# Patient Record
Sex: Male | Born: 1945 | Race: White | Hispanic: No | Marital: Married | Smoking: Light tobacco smoker
Health system: Southern US, Community
[De-identification: ages and names within clinical notes are randomized; demographics above are authoritative.]

## PROBLEM LIST (undated history)

## (undated) DIAGNOSIS — M25819 Other specified joint disorders, unspecified shoulder: Secondary | ICD-10-CM

## (undated) DIAGNOSIS — R55 Syncope and collapse: Secondary | ICD-10-CM

## (undated) DIAGNOSIS — M7540 Impingement syndrome of unspecified shoulder: Secondary | ICD-10-CM

## (undated) DIAGNOSIS — I428 Other cardiomyopathies: Secondary | ICD-10-CM

## (undated) DIAGNOSIS — I4891 Unspecified atrial fibrillation: Secondary | ICD-10-CM

## (undated) DIAGNOSIS — I442 Atrioventricular block, complete: Secondary | ICD-10-CM

## (undated) DIAGNOSIS — R0602 Shortness of breath: Secondary | ICD-10-CM

## (undated) DIAGNOSIS — C787 Secondary malignant neoplasm of liver and intrahepatic bile duct: Secondary | ICD-10-CM

## (undated) DIAGNOSIS — I509 Heart failure, unspecified: Secondary | ICD-10-CM

## (undated) DIAGNOSIS — C189 Malignant neoplasm of colon, unspecified: Secondary | ICD-10-CM

## (undated) DIAGNOSIS — I2510 Atherosclerotic heart disease of native coronary artery without angina pectoris: Secondary | ICD-10-CM

## (undated) DIAGNOSIS — I10 Essential (primary) hypertension: Secondary | ICD-10-CM

## (undated) DIAGNOSIS — I5022 Chronic systolic (congestive) heart failure: Secondary | ICD-10-CM

## (undated) DIAGNOSIS — Z950 Presence of cardiac pacemaker: Secondary | ICD-10-CM

## (undated) HISTORY — PX: LIVER RESECTION: SHX1977

## (undated) HISTORY — PX: REFRACTIVE SURGERY: SHX103

## (undated) HISTORY — DX: Syncope and collapse: R55

## (undated) HISTORY — DX: Atrioventricular block, complete: I44.2

## (undated) HISTORY — PX: COLON SURGERY: SHX602

---

## 2012-11-02 ENCOUNTER — Encounter (HOSPITAL_COMMUNITY): Payer: Self-pay | Admitting: Emergency Medicine

## 2012-11-02 ENCOUNTER — Institutional Professional Consult (permissible substitution): Payer: Self-pay | Admitting: Cardiology

## 2012-11-02 ENCOUNTER — Encounter (HOSPITAL_COMMUNITY): Payer: Self-pay

## 2012-11-02 ENCOUNTER — Emergency Department (HOSPITAL_COMMUNITY): Payer: Medicare Other

## 2012-11-02 ENCOUNTER — Inpatient Hospital Stay (HOSPITAL_COMMUNITY)
Admission: EM | Admit: 2012-11-02 | Discharge: 2012-11-06 | DRG: 244 | Disposition: A | Discharge status: Home / Self Care | Payer: Medicare Other | Attending: Cardiology | Admitting: Cardiology

## 2012-11-02 DIAGNOSIS — I459 Conduction disorder, unspecified: Secondary | ICD-10-CM | POA: Diagnosis present

## 2012-11-02 DIAGNOSIS — Z79899 Other long term (current) drug therapy: Secondary | ICD-10-CM

## 2012-11-02 DIAGNOSIS — W19XXXA Unspecified fall, initial encounter: Secondary | ICD-10-CM | POA: Diagnosis present

## 2012-11-02 DIAGNOSIS — F17200 Nicotine dependence, unspecified, uncomplicated: Secondary | ICD-10-CM | POA: Diagnosis present

## 2012-11-02 DIAGNOSIS — I10 Essential (primary) hypertension: Secondary | ICD-10-CM | POA: Diagnosis present

## 2012-11-02 DIAGNOSIS — Y92009 Unspecified place in unspecified non-institutional (private) residence as the place of occurrence of the external cause: Secondary | ICD-10-CM

## 2012-11-02 DIAGNOSIS — I442 Atrioventricular block, complete: Principal | ICD-10-CM | POA: Diagnosis present

## 2012-11-02 DIAGNOSIS — S022XXA Fracture of nasal bones, initial encounter for closed fracture: Secondary | ICD-10-CM | POA: Diagnosis present

## 2012-11-02 DIAGNOSIS — R55 Syncope and collapse: Secondary | ICD-10-CM

## 2012-11-02 DIAGNOSIS — Y998 Other external cause status: Secondary | ICD-10-CM

## 2012-11-02 HISTORY — DX: Essential (primary) hypertension: I10

## 2012-11-02 LAB — GLUCOSE, CAPILLARY: Glucose-Capillary: 129 mg/dL — ABNORMAL HIGH (ref 70–99)

## 2012-11-02 LAB — URINALYSIS, ROUTINE W REFLEX MICROSCOPIC
Bilirubin Urine: NEGATIVE
Glucose, UA: NEGATIVE mg/dL
Ketones, ur: NEGATIVE mg/dL
Leukocytes, UA: NEGATIVE
Nitrite: NEGATIVE
Protein, ur: NEGATIVE mg/dL
Specific Gravity, Urine: 1.017 (ref 1.005–1.030)
Urobilinogen, UA: 0.2 mg/dL (ref 0.0–1.0)
pH: 5.5 (ref 5.0–8.0)

## 2012-11-02 LAB — BASIC METABOLIC PANEL
CO2: 25 mEq/L (ref 19–32)
Chloride: 100 mEq/L (ref 96–112)
Glucose, Bld: 125 mg/dL — ABNORMAL HIGH (ref 70–99)
Potassium: 4 mEq/L (ref 3.5–5.1)
Sodium: 134 mEq/L — ABNORMAL LOW (ref 135–145)

## 2012-11-02 LAB — URINE MICROSCOPIC-ADD ON

## 2012-11-02 LAB — TROPONIN I: Troponin I: <0.3 ng/mL (ref <0.30)

## 2012-11-02 LAB — CBC
Hemoglobin: 15.5 g/dL (ref 13.0–17.0)
RBC: 4.79 MIL/uL (ref 4.22–5.81)

## 2012-11-02 LAB — CREATININE, SERUM: Creatinine, Ser: 0.76 mg/dL (ref 0.50–1.35)

## 2012-11-02 MED ORDER — ZOLPIDEM TARTRATE 5 MG PO TABS: 5.0000 mg | ORAL_TABLET | Freq: Every evening | ORAL | Status: DC | PRN

## 2012-11-02 MED ORDER — ONDANSETRON HCL 4 MG/2ML IJ SOLN: 4.0000 mg | Freq: Four times a day (QID) | INTRAMUSCULAR | Status: DC | PRN

## 2012-11-02 MED ORDER — ENOXAPARIN SODIUM 40 MG/0.4ML ~~LOC~~ SOLN
40.0000 mg | SUBCUTANEOUS | Status: DC
  Filled 2012-11-02 (×5): qty 0.4

## 2012-11-02 MED ORDER — MAGNESIUM 100 MG PO TABS: 100.0000 mg | ORAL_TABLET | Freq: Every day | ORAL | Status: DC

## 2012-11-02 MED ORDER — NITROGLYCERIN 0.4 MG SL SUBL: 0.4000 mg | SUBLINGUAL_TABLET | SUBLINGUAL | Status: DC | PRN

## 2012-11-02 MED ORDER — ADULT MULTIVITAMIN W/MINERALS CH: 1.0000 | ORAL_TABLET | Freq: Every day | ORAL | Status: DC

## 2012-11-02 MED ORDER — VITAMIN C 500 MG PO TABS: 500.0000 mg | ORAL_TABLET | Freq: Every day | ORAL | Status: DC

## 2012-11-02 MED ORDER — LISINOPRIL 10 MG PO TABS: 15.0000 mg | ORAL_TABLET | Freq: Every day | ORAL | Status: DC

## 2012-11-02 MED ORDER — ASPIRIN EC 81 MG PO TBEC
81.0000 mg | DELAYED_RELEASE_TABLET | Freq: Every day | ORAL | Status: DC
  Administered 2012-11-04 – 2012-11-06 (×3): 81 mg via ORAL
  Filled 2012-11-02 (×4): qty 1

## 2012-11-02 MED ORDER — POTASSIUM 75 MG PO TABS: 75.0000 mg | ORAL_TABLET | Freq: Every day | ORAL | Status: DC

## 2012-11-02 MED ORDER — ALPRAZOLAM 0.25 MG PO TABS: 0.2500 mg | ORAL_TABLET | Freq: Two times a day (BID) | ORAL | Status: DC | PRN

## 2012-11-02 MED ORDER — SODIUM CHLORIDE 0.9 % IV SOLN: 250.0000 mL | INTRAVENOUS | Status: DC | PRN

## 2012-11-02 MED ORDER — SODIUM CHLORIDE 0.9 % IJ SOLN
3.0000 mL | Freq: Two times a day (BID) | INTRAMUSCULAR | Status: DC
  Administered 2012-11-03 – 2012-11-05 (×4): 3 mL via INTRAVENOUS

## 2012-11-02 MED ORDER — ACETAMINOPHEN 325 MG PO TABS
650.0000 mg | ORAL_TABLET | ORAL | Status: DC | PRN
  Administered 2012-11-05: 650 mg via ORAL
  Filled 2012-11-02: qty 2

## 2012-11-02 MED ORDER — SODIUM CHLORIDE 0.9 % IJ SOLN: 3.0000 mL | INTRAMUSCULAR | Status: DC | PRN

## 2012-11-02 NOTE — ED Notes (Signed)
Called report to Dora, RN>

## 2012-11-02 NOTE — Consult Note (Addendum)
CARDIOLOGY CONSULT NOTE   Patient ID: Tan Clopper MRN: [id number] DOB/AGE: 1946-03-21 67 y.o.  Admit date: 11/02/2012  Primary Physician   Default, Provider, MD Primary Cardiologist  ? [facility] - Had appt scheduled for today. Reason for Consultation   Syncope and collapse  HPI: Mr. Colina is a 67 year old male with past history of HTN who presents to the ED with a syncopal episode. He has been having episodic presyncope. Various times of day, not consistently associated with exertion. For the last 3 weeks, has had episodes almost daily, sometimes more than 1/day. Symptoms improve with sitting down. Pt has had episodes of a "blah" feeling that start while sitting and resolve spontaneously. Pt has had no orthostatic symptoms. Pt has also had brief episodes of a light-headed feeling when he turns his head suddenly during exercise. Once it was severe enough to require an ER visit but he was hydrated and released. He uses No-Salt on foods. Saw his family MD yesterday for the light-headed feeling and had orthostatic VS done which were negative.  He has never had palpitations or chest pain. In the last 20 days, he has had about 10 episodes.  Today, he had walked around, down a hill and was walking back up. He began feeling light-headed and bent over. He fell, briefly losing consciousness. He got up almost immediately and went into the house, going into the kitchen. His wife was present and he was doing appropriate things, wiping the blood off, not staggering or swerving, but was not completely aware of what was going on for a short period of time.    Past Medical History  Diagnosis Date  . Hypertension   . Syncope    Remote history of dehydration with exertion, leading to syncope.  approx 6 years ago.     Past Surgical History  Procedure Date  . Colon surgery   . Liver resection     Liver CA    No Known Allergies  I have reviewed the patient's current medications  Prior to Admission medications    Medication Sig Start Date End Date Taking? Authorizing Provider  Ascorbic Acid (VITAMIN C PO) Take 1 tablet by mouth daily. OTC   Yes Historical Provider, MD  lisinopril (PRINIVIL,ZESTRIL) 10 MG tablet Take 15 mg by mouth daily.   Yes Historical Provider, MD  MAGNESIUM PO Take 1 capsule by mouth daily. OTC   Yes Historical Provider, MD  Multiple Vitamin (MULTIVITAMIN WITH MINERALS) TABS Take 1 tablet by mouth daily.   Yes Historical Provider, MD  POTASSIUM PO Take 1 tablet by mouth daily. OTC   Yes Historical Provider, MD     History   Social History  . Marital Status: Married    Spouse Name: N/A    Number of Children: N/A  . Years of Education: N/A   Occupational History  . Not on file.   Social History Main Topics  . Smoking status: Light Tobacco Smoker    Types: Cigars  . Smokeless tobacco: Never Used  . Alcohol Use: Yes     Comment: very rarely  . Drug Use: No  . Sexually Active:    Other Topics Concern  . Not on file   Social History Narrative  . No narrative on file    Family Status  Relation Status Death Age  . Mother Deceased   . Father Deceased     Family History  Problem Relation Age of Onset  . Stroke Mother  95s  . Stroke Father 60     ROS: No recent illnesses, fevers or chills.   Full 14 point review of systems complete and found to be negative unless listed above.  Physical Exam: Blood pressure 150/77, pulse 79, temperature 98.4 F (36.9 C), temperature source Oral, resp. rate 17, SpO2 97.00%.  General: Well developed, well nourished, male in no acute distress Head: Eyes PERRLA, No xanthomas.   Normocephalic and atraumatic, oropharynx without edema or exudate. Dentition: good Lungs: clear bilateral  Heart: HRRR S1 S2, no rub/gallop, 2/6 systolic murmur. pulses are 2+ all 4 extrem.   Neck: No carotid bruits. No lymphadenopathy.  JVD not elevated. Abdomen: Bowel sounds present, abdomen soft and non-tender without masses or hernias noted. Msk:   No spine or cva tenderness. No weakness, no joint deformities or effusions. Extremities: No clubbing or cyanosis. No edema.  Neuro: Alert and oriented X 3. No focal deficits noted. Psych:  Good affect, responds appropriately Skin: No rashes, facial abrasions noted, deepest one just under the right side of his nose.  Labs:   Lab Results  Component Value Date   WBC 8.7 11/02/2012   HGB 15.5 11/02/2012   HCT 44.1 11/02/2012   MCV 92.1 11/02/2012   PLT 163 11/02/2012      Lab 11/02/12 1233  NA 134*  K 4.0  CL 100  CO2 25  BUN 18  CREATININE 0.75  CALCIUM 9.3  PROT --  BILITOT --  ALKPHOS --  ALT --  AST --  GLUCOSE 125*    Basename 11/02/12 1233  CKTOTAL --  CKMB --  TROPONINI <0.30   ECG:   02-Nov-2012 12:14:11 SINUS RHYTHM ~ normal P axis, V-rate 50- 99 FIRST DEGREE AV BLOCK ~ PR >220, V-rate 50- 90 RBBB AND LPFB ~ QRSd >177mS, axis(90,210) Standard 12 Lead Report ~ Unconfirmed Interpretation Abnormal ECG 62mm/s 85mm/mV 150Hz  8.0.1 12SL 235 CID: [id number] Referred by: Unconfirmed Vent. rate 88 BPM PR interval 268 ms QRS duration 140 ms QT/QTc 392/474 ms P-R-T axes 62 86 48  Radiology:   Ct Maxillofacial Wo Cm 11/02/2012  *RADIOLOGY REPORT*  Clinical Data:  Syncopal episode.  Fell.  CT HEAD WITHOUT CONTRAST CT MAXILLOFACIAL WITHOUT CONTRAST  Technique:  Multidetector CT imaging of the head and maxillofacial structures were performed using the standard protocol without intravenous contrast. Multiplanar CT image reconstructions of the maxillofacial structures were also generated.  Comparison:   None.  CT HEAD  Findings: The ventricles are normal.  No extra-axial fluid collections are seen.  The brainstem and cerebellum are unremarkable.  No acute intracranial findings such as infarction or hemorrhage.  No mass lesions.  The bony structures are intact.  No skull fracture or bone lesion. There is fluid in the left maxillary sinus and a fractured left nasal bone.  The mastoid  air cells and middle ear cavities are clear.  IMPRESSION: No acute intracranial findings or skull fracture.  CT MAXILLOFACIAL  Findings:   There is a mildly displaced fracture of the left nasal bone and a small nondisplaced fracture involving the right nasal bone.  A fracture of the bony nasal bridge is also noted.  There is associated fluid in the left ethmoid air cells and left maxillary sinus.  The bony nasal septum is intact.  The maxillary sinus walls are intact and the orbital bones are intact.  The globes appear normal.  The mandible is intact and the mandibular condyles are normally located.  IMPRESSION:  1.  Nasal  bone fractures with displacement to the left. 2.  No other facial bone fractures are identified. 3.  Fluid/blood in the left maxillary sinus and scattered left- sided ethmoid air cells.   Original Report Authenticated By: Rudie Meyer, M.D.     ASSESSMENT AND PLAN:    Principal Problem:  *Syncope and collapse - admit, cycle enzymes, ck echo, keep on telemetry. If all is OK, consider D/C in am and outpatient f/u with event monitor.  Otherwise, continue home meds.  Active Problems:  Hypertension   Signed: Caroline More PA-S2 11/02/2012, 4:40 PM Co-Sign MD  Seen and agree, changes made. Theodore Demark, PA 11/02/2012 4:46 PM  As above, patient seen and examined. Briefly he is a 67 year old male with a past medical history of hypertension who presents with syncope. He has noticed some dyspnea on exertion recently but no orthopnea, PND, pedal edema, palpitations or exertional chest pain. Over the past 3 weeks he has had "lightheaded" spells. These are not related to position. They occur suddenly and resolve within one to 2 seconds. There is no associated nausea, dyspnea, chest pain or palpitations. No neurological symptoms. Today while walking up his driveway he developed an episode. He bent down to see if his symptoms would resolve and had a frank syncopal episode. He had trauma to  his face. He was unconscious for a brief amount of time but cannot quantitate. He is presently asymptomatic. His electrocardiogram shows sinus rhythm, first degree AV block and right bundle branch block. CT shows nasal bone fractures with displacement to the left. Initial cardiac markers negative. Patient syncope is concerning for bradycardia mediated event. Plan to admit to telemetry. Cycle enzymes. Check echocardiogram for LV function. He will also need a functional study to exclude ischemia. He will ultimately need an electrophysiology consult. If his telemetry is unrevealing he may require EP study to measure HV interval and if prolonged pacemaker placement. I discussed this with the patient. He stated that he would stay overnight but would not agree to stay for the weekend. I stated that if he leaves he will need to sign out AGAINST MEDICAL ADVICE. He would also risk further syncope and trauma. I explained that if he leaves he cannot drive. I am not convinced that he will stay in the hospital. Olga Millers 5:28 PM

## 2012-11-02 NOTE — ED Provider Notes (Addendum)
Medical screening examination/treatment/procedure(s) were conducted as a shared visit with non-physician practitioner(s) and myself.  I personally evaluated the patient during the encounter  The patient's story is concerning for cardiogenic syncope.  Cardiology will likely with patient at bedside.  EKG without any QT prolongation or significant abnormalities.  No arrhythmia noted.  I reviewed the EMS strips which had several episodes of single PVCs without other ectopy noted.  Given his exertional shortness of breath with exertional dizziness and somewhat one episode of palpitations I think the patient will need an aggressive workup in the hospital to rule out significant pathology.  Fracture Reduction Patient consented to fracture reduction of his nasal bones.  They were reduced with manipulation with improvement in the alignment of his nasal bones as well as his breathing.  Patient tolerated the procedure well.  No sedation.  Lyanne Co, MD 11/02/12 1510  Lyanne Co, MD 11/02/12 [id number]

## 2012-11-02 NOTE — ED Notes (Signed)
Patient has an abrasion to his forehead and right hand. He has bruising to his nose, which is fractured.

## 2012-11-02 NOTE — ED Notes (Signed)
2nd attempt made to give report, caesar, RN is in shift report. New RN will call when available.

## 2012-11-02 NOTE — ED Notes (Signed)
Attempted to give report to Caesar, Charity fundraiser. Unavailable at this time.

## 2012-11-02 NOTE — ED Provider Notes (Signed)
History     CSN: [id number]  Arrival date & time 11/02/12  1213   First MD Initiated Contact with Patient 11/02/12 1218      Chief Complaint  Patient presents with  . Loss of Consciousness    (Consider location/radiation/quality/duration/timing/severity/associated sxs/prior treatment) HPI Patient presents to the emergency department following a syncopal episode.  Patient states that he was walking up his driveway at the time that he passed out.  Patient states that over the last little, while he's had lightheadedness and dizziness, and saw his primary care doctor yesterday for the symptoms.  Patient states that he also had exertional shortness of breath during this same time frame.  Patient denies chest pain, nausea, vomiting, weakness, numbness, abdominal pain, blurred vision, headache, neck pain, or fever.  Past Medical History  Diagnosis Date  . Hypertension   . Syncope     Past Surgical History  Procedure Date  . Colon surgery   . Liver resection     Liver CA    No family history on file.  History  Substance Use Topics  . Smoking status: Light Tobacco Smoker    Types: Cigars  . Smokeless tobacco: Never Used  . Alcohol Use: Yes     Comment: very rarely      Review of Systems All other systems negative except as documented in the HPI. All pertinent positives and negatives as reviewed in the HPI. Allergies  Review of patient's allergies indicates no known allergies.  Home Medications   Current Outpatient Rx  Name  Route  Sig  Dispense  Refill  . VITAMIN C PO   Oral   Take 1 tablet by mouth daily. OTC         . LISINOPRIL 10 MG PO TABS   Oral   Take 15 mg by mouth daily.         Marland Kitchen MAGNESIUM PO   Oral   Take 1 capsule by mouth daily. OTC         . ADULT MULTIVITAMIN W/MINERALS CH   Oral   Take 1 tablet by mouth daily.         Marland Kitchen POTASSIUM PO   Oral   Take 1 tablet by mouth daily. OTC           BP 186/75  Pulse 86  Temp 98.4 F (36.9  C) (Oral)  Resp 24  SpO2 97%  Physical Exam  Nursing note and vitals reviewed. Constitutional: He is oriented to person, place, and time. He appears well-developed and well-nourished. No distress.  HENT:  Head: Normocephalic. Head is with abrasion.    Mouth/Throat: Oropharynx is clear and moist.  Eyes: EOM are normal. Pupils are equal, round, and reactive to light.  Neck: Normal range of motion. Neck supple.  Cardiovascular: Normal rate, regular rhythm and normal heart sounds.  Exam reveals no gallop and no friction rub.   No murmur heard. Pulmonary/Chest: Effort normal and breath sounds normal. No respiratory distress.  Neurological: He is alert and oriented to person, place, and time. He has normal strength. No sensory deficit. He displays a negative Romberg sign. Coordination and gait normal.  Skin: Skin is warm and dry.    ED Course  Procedures (including critical care time)  Labs Reviewed  BASIC METABOLIC PANEL - Abnormal; Notable for the following:    Sodium 134 (*)     Glucose, Bld 125 (*)     All other components within normal limits  URINALYSIS, ROUTINE W  REFLEX MICROSCOPIC - Abnormal; Notable for the following:    Hgb urine dipstick TRACE (*)     All other components within normal limits  GLUCOSE, CAPILLARY - Abnormal; Notable for the following:    Glucose-Capillary 129 (*)     All other components within normal limits  CBC  TROPONIN I  URINE MICROSCOPIC-ADD ON   Ct Head Wo Contrast  11/02/2012  *RADIOLOGY REPORT*  Clinical Data:  Syncopal episode.  Fell.  CT HEAD WITHOUT CONTRAST CT MAXILLOFACIAL WITHOUT CONTRAST  Technique:  Multidetector CT imaging of the head and maxillofacial structures were performed using the standard protocol without intravenous contrast. Multiplanar CT image reconstructions of the maxillofacial structures were also generated.  Comparison:   None.  CT HEAD  Findings: The ventricles are normal.  No extra-axial fluid collections are seen.  The  brainstem and cerebellum are unremarkable.  No acute intracranial findings such as infarction or hemorrhage.  No mass lesions.  The bony structures are intact.  No skull fracture or bone lesion. There is fluid in the left maxillary sinus and a fractured left nasal bone.  The mastoid air cells and middle ear cavities are clear.  IMPRESSION: No acute intracranial findings or skull fracture.  CT MAXILLOFACIAL  Findings:   There is a mildly displaced fracture of the left nasal bone and a small nondisplaced fracture involving the right nasal bone.  A fracture of the bony nasal bridge is also noted.  There is associated fluid in the left ethmoid air cells and left maxillary sinus.  The bony nasal septum is intact.  The maxillary sinus walls are intact and the orbital bones are intact.  The globes appear normal.  The mandible is intact and the mandibular condyles are normally located.  IMPRESSION:  1.  Nasal bone fractures with displacement to the left. 2.  No other facial bone fractures are identified. 3.  Fluid/blood in the left maxillary sinus and scattered left- sided ethmoid air cells.   Original Report Authenticated By: Rudie Meyer, M.D.    Ct Maxillofacial Wo Cm  11/02/2012  *RADIOLOGY REPORT*  Clinical Data:  Syncopal episode.  Fell.  CT HEAD WITHOUT CONTRAST CT MAXILLOFACIAL WITHOUT CONTRAST  Technique:  Multidetector CT imaging of the head and maxillofacial structures were performed using the standard protocol without intravenous contrast. Multiplanar CT image reconstructions of the maxillofacial structures were also generated.  Comparison:   None.  CT HEAD  Findings: The ventricles are normal.  No extra-axial fluid collections are seen.  The brainstem and cerebellum are unremarkable.  No acute intracranial findings such as infarction or hemorrhage.  No mass lesions.  The bony structures are intact.  No skull fracture or bone lesion. There is fluid in the left maxillary sinus and a fractured left nasal bone.   The mastoid air cells and middle ear cavities are clear.  IMPRESSION: No acute intracranial findings or skull fracture.  CT MAXILLOFACIAL  Findings:   There is a mildly displaced fracture of the left nasal bone and a small nondisplaced fracture involving the right nasal bone.  A fracture of the bony nasal bridge is also noted.  There is associated fluid in the left ethmoid air cells and left maxillary sinus.  The bony nasal septum is intact.  The maxillary sinus walls are intact and the orbital bones are intact.  The globes appear normal.  The mandible is intact and the mandibular condyles are normally located.  IMPRESSION:  1.  Nasal bone fractures with displacement to  the left. 2.  No other facial bone fractures are identified. 3.  Fluid/blood in the left maxillary sinus and scattered left- sided ethmoid air cells.   Original Report Authenticated By: Rudie Meyer, M.D.      1. Syncope    I spoke with [facility] cardiology and they will be down to see the patient.  My concern is the patient's having exertional shortness of breath, along with this syncopal episode.  There could be a cardiac cause for his syncope.   MDM  MDM Reviewed: nursing note and vitals Interpretation: labs, ECG and CT scan Consults: cardiology    Date: 11/02/2012  Rate: 88 Rhythm: normal sinus rhythm  QRS Axis: normal  Intervals: normal  ST/T Wave abnormalities: normal  Conduction Disutrbances:first-degree A-V block   Narrative Interpretation:   Old EKG Reviewed: none available            Carlyle Dolly, PA-C 11/02/12 1504

## 2012-11-02 NOTE — ED Notes (Signed)
Per EMS: Pt went to retrieve his newspaper and had a syncopal episode. Hx HTN. Takes lisinopril. scapes and minor injuries to face and fingers of R hand. CBG 129.

## 2012-11-03 DIAGNOSIS — I059 Rheumatic mitral valve disease, unspecified: Secondary | ICD-10-CM

## 2012-11-03 LAB — COMPREHENSIVE METABOLIC PANEL
ALT: 32 U/L (ref 0–53)
Alkaline Phosphatase: 52 U/L (ref 39–117)
CO2: 23 mEq/L (ref 19–32)
Calcium: 8.8 mg/dL (ref 8.4–10.5)
GFR calc Af Amer: >90 mL/min (ref >90)
GFR calc non Af Amer: >90 mL/min (ref >90)
Glucose, Bld: 113 mg/dL — ABNORMAL HIGH (ref 70–99)
Sodium: 136 mEq/L (ref 135–145)

## 2012-11-03 LAB — MRSA PCR SCREENING: MRSA by PCR: NEGATIVE

## 2012-11-03 NOTE — Progress Notes (Signed)
Patient: Jaceyon Strole Date of Encounter: 11/03/2012, 9:41 AM Admit date: 11/02/2012     Subjective  Mr. Pelissier reports "lightheadedness" this morning ~5 AM after he used the bathroom. This felt similar to what he has been experiencing daily x 2-3 weeks. Had syncope with significant facial trauma yesterday prompting his admission. There is evidence of CHB on telemetry at 5:09 AM today with 3.5 second pauses.     Objective  Physical Exam: Vitals: BP 128/78  Pulse 67  Temp 98.1 F (36.7 C) (Oral)  Resp 17  Ht 5\' 9"  (1.753 m)  Wt 213 lb 13.5 oz (97 kg)  BMI 31.58 kg/m2  SpO2 95% General: Well developed, well appearing 67 year old male in no acute distress. Neck: Supple. JVD not elevated. Lungs: Clear bilaterally to auscultation without wheezes, rales, or rhonchi. Breathing is unlabored. Heart: RRR S1 S2 without murmur, rub or gallop.  Abdomen: Soft, non-distended. Extremities: No clubbing or cyanosis. No edema.  Distal pedal pulses are 2+ and equal bilaterally. Neuro: Alert and oriented X 3. Moves all extremities spontaneously. No focal deficits.  Intake/Output: No intake or output data in the 24 hours ending 11/03/12 0941  Inpatient Medications:     . aspirin EC  81 mg Oral Daily  . enoxaparin (LOVENOX) injection  40 mg Subcutaneous Q24H  . sodium chloride  3 mL Intravenous Q12H    Labs:  Basename 11/03/12 0521 11/02/12 1813 11/02/12 1233  NA 136 -- 134*  K 4.1 -- 4.0  CL 99 -- 100  CO2 23 -- 25  GLUCOSE 113* -- 125*  BUN 15 -- 18  CREATININE 0.76 0.76 --  CALCIUM 8.8 -- 9.3  MG -- -- --  PHOS -- -- --    Basename 11/03/12 0521  AST 28  ALT 32  ALKPHOS 52  BILITOT 1.4*  PROT 6.9  ALBUMIN 3.5    Basename 11/02/12 1233  WBC 8.7  NEUTROABS --  HGB 15.5  HCT 44.1  MCV 92.1  PLT 163    Basename 11/03/12 0521 11/02/12 2339 11/02/12 1813 11/02/12 1233  CKTOTAL -- -- -- --  CKMB -- -- -- --  TROPONINI <0.30 <0.30 <0.30 <0.30     Basename 11/02/12 1813   TSH 0.588  T4TOTAL --  T3FREE --  THYROIDAB --    Radiology/Studies: Ct Head Wo Contrast  11/02/2012  *RADIOLOGY REPORT*  Clinical Data:  Syncopal episode.  Fell.  CT HEAD WITHOUT CONTRAST CT MAXILLOFACIAL WITHOUT CONTRAST  Technique:  Multidetector CT imaging of the head and maxillofacial structures were performed using the standard protocol without intravenous contrast. Multiplanar CT image reconstructions of the maxillofacial structures were also generated.  Comparison:   None.  CT HEAD  Findings: The ventricles are normal.  No extra-axial fluid collections are seen.  The brainstem and cerebellum are unremarkable.  No acute intracranial findings such as infarction or hemorrhage.  No mass lesions.  The bony structures are intact.  No skull fracture or bone lesion. There is fluid in the left maxillary sinus and a fractured left nasal bone.  The mastoid air cells and middle ear cavities are clear.  IMPRESSION: No acute intracranial findings or skull fracture.  CT MAXILLOFACIAL  Findings:   There is a mildly displaced fracture of the left nasal bone and a small nondisplaced fracture involving the right nasal bone.  A fracture of the bony nasal bridge is also noted.  There is associated fluid in the left ethmoid air cells and  left maxillary sinus.  The bony nasal septum is intact.  The maxillary sinus walls are intact and the orbital bones are intact.  The globes appear normal.  The mandible is intact and the mandibular condyles are normally located.  IMPRESSION:  1.  Nasal bone fractures with displacement to the left. 2.  No other facial bone fractures are identified. 3.  Fluid/blood in the left maxillary sinus and scattered left- sided ethmoid air cells.   Original Report Authenticated By: Rudie Meyer, M.D.    Ct Maxillofacial Wo Cm  11/02/2012  *RADIOLOGY REPORT*  Clinical Data:  Syncopal episode.  Fell.  CT HEAD WITHOUT CONTRAST CT MAXILLOFACIAL WITHOUT CONTRAST  Technique:  Multidetector CT imaging  of the head and maxillofacial structures were performed using the standard protocol without intravenous contrast. Multiplanar CT image reconstructions of the maxillofacial structures were also generated.  Comparison:   None.  CT HEAD  Findings: The ventricles are normal.  No extra-axial fluid collections are seen.  The brainstem and cerebellum are unremarkable.  No acute intracranial findings such as infarction or hemorrhage.  No mass lesions.  The bony structures are intact.  No skull fracture or bone lesion. There is fluid in the left maxillary sinus and a fractured left nasal bone.  The mastoid air cells and middle ear cavities are clear.  IMPRESSION: No acute intracranial findings or skull fracture.  CT MAXILLOFACIAL  Findings:   There is a mildly displaced fracture of the left nasal bone and a small nondisplaced fracture involving the right nasal bone.  A fracture of the bony nasal bridge is also noted.  There is associated fluid in the left ethmoid air cells and left maxillary sinus.  The bony nasal septum is intact.  The maxillary sinus walls are intact and the orbital bones are intact.  The globes appear normal.  The mandible is intact and the mandibular condyles are normally located.  IMPRESSION:  1.  Nasal bone fractures with displacement to the left. 2.  No other facial bone fractures are identified. 3.  Fluid/blood in the left maxillary sinus and scattered left- sided ethmoid air cells.   Original Report Authenticated By: Rudie Meyer, M.D.     Echocardiogram: pending  Telemetry: sinus rhythm with first degree AV block, RBBB; intermittent CHB with 3.5 second pauses this AM    Assessment and Plan  1. Complete heart block 2. Syncope, most likely due to #1 Mr. Ashe now has documented symptomatic CHB which is most likely the cause for his syncopal episode yesterday. His ECG from admission shows conduction abnormalities at baseline with 1st degree AV block and RBBB. CEs negative. TSH within normal  range. No reversible causes identified. Discussed indications and need for PPM with Mr. Novosel and his wife. This can be done on Monday. Echo to be done today to assess heart structure and function. Will transfer him to the CCU and continue to monitor.   Signed, Rick Duff PA-C  Patient examined chart and telemetry reviewed.  5:00 am had CHB with rates in 20 with lightheadedness.  Clearly his syncope is related to this.  Long discussion about need for  Pacer.  Willing to stay.  Transfer to unit.  Hopefully echo can be done over weekend  [employer]

## 2012-11-03 NOTE — Progress Notes (Signed)
  Echocardiogram 2D Echocardiogram has been performed.  Randen Kauth FRANCES 11/03/2012, 4:48 PM

## 2012-11-03 NOTE — Progress Notes (Signed)
Pt transferred to 2900, report called to [facility].

## 2012-11-03 NOTE — Progress Notes (Signed)
Pt refused to take his Lovenox injection. Pt stated that he does not have to take same because he is having surgery.  Pt education done. Pt verbalized understanding. We will continue to educate and support patient.

## 2012-11-03 NOTE — H&P (Signed)
Please see note labeled consult from 1-24 [facility]

## 2012-11-03 NOTE — Care Management Utilization Note (Signed)
UR completed 

## 2012-11-03 NOTE — Progress Notes (Signed)
Notified Plitt, PA of changes on telemetry. PA came to the floor and stated the patient was having blocked PAC's with a long PR interval afterwards. Patient is asymptomatic. Will continue to monitor. Earnest Conroy RN

## 2012-11-04 DIAGNOSIS — I442 Atrioventricular block, complete: Secondary | ICD-10-CM | POA: Diagnosis present

## 2012-11-04 MED ORDER — SODIUM CHLORIDE 0.9 % IJ SOLN: 3.0000 mL | Freq: Two times a day (BID) | INTRAMUSCULAR | Status: DC

## 2012-11-04 MED ORDER — CHLORHEXIDINE GLUCONATE 4 % EX LIQD
60.0000 mL | Freq: Once | CUTANEOUS | Status: AC
  Administered 2012-11-04: 4 via TOPICAL
  Filled 2012-11-04: qty 60

## 2012-11-04 MED ORDER — CHLORHEXIDINE GLUCONATE 4 % EX LIQD
60.0000 mL | Freq: Once | CUTANEOUS | Status: AC
  Administered 2012-11-05: 4 via TOPICAL
  Filled 2012-11-04 (×2): qty 60

## 2012-11-04 MED ORDER — SODIUM CHLORIDE 0.9 % IJ SOLN: 3.0000 mL | INTRAMUSCULAR | Status: DC | PRN

## 2012-11-04 MED ORDER — SODIUM CHLORIDE 0.9 % IV SOLN
250.0000 mL | INTRAVENOUS | Status: DC
  Administered 2012-11-05: 50 mL via INTRAVENOUS

## 2012-11-04 MED ORDER — SODIUM CHLORIDE 0.45 % IV SOLN
INTRAVENOUS | Status: DC
  Administered 2012-11-05: 50 mL via INTRAVENOUS

## 2012-11-04 MED ORDER — CEFAZOLIN SODIUM-DEXTROSE 2-3 GM-% IV SOLR
2.0000 g | INTRAVENOUS | Status: DC
  Filled 2012-11-04: qty 50

## 2012-11-04 MED ORDER — SODIUM CHLORIDE 0.9 % IR SOLN
80.0000 mg | Status: DC
  Filled 2012-11-04: qty 2

## 2012-11-04 NOTE — Progress Notes (Signed)
Pt. Refusing to take ASA as prescribed by MD.  Pt. Does not wish to view video on PM, but did take the informational booklet for he and his wife.  Procedure described to pt. And wife.  Pt is, by his own admission, not wishing to go through with PM implantation, but when alternatives described to him, did consent to procedure.  Provided time for he and his wife to ask appropriate questions.  Utilized "teach-back" method.  Pt. States he is "not one to take medications or to go to any doctor's office."  Importance of post-PM care re-iterated.

## 2012-11-04 NOTE — Progress Notes (Signed)
Pt wanting to leave AMA.  Educated pt. Md notified. Pt refused to talk with Md.  Wife refused to take pt home.  Pt staying but not happy.   Will continue to monitor. Emilie [place]

## 2012-11-04 NOTE — Progress Notes (Signed)
   SUBJECTIVE:  No further complaints.   PHYSICAL EXAM Filed Vitals:   11/03/12 2200 11/04/12 0200 11/04/12 0554 11/04/12 0808  BP: 124/59 128/68 133/67   Pulse: 63 63 69   Temp:  98 F (36.7 C) 97.5 F (36.4 C) 98.3 F (36.8 C)  TempSrc:  Oral Oral Oral  Resp:    20  Height:      Weight:   214 lb 11.7 oz (97.4 kg)   SpO2: 94% 95% 95%    General:  No distress Lungs:  Clear Heart:  RRR Abdomen:  Positive bowel sounds, no rebound no guarding Extremities:  No edema  LABS: Lab Results  Component Value Date   TROPONINI <0.30 11/03/2012   Results for orders placed during the hospital encounter of 11/02/12 (from the past 24 hour(s))  MRSA PCR SCREENING     Status: Normal   Collection Time   11/03/12  2:23 PM      Component Value Range   MRSA by PCR NEGATIVE  NEGATIVE    Intake/Output Summary (Last 24 hours) at 11/04/12 1036 Last data filed at 11/04/12 0600  Gross per 24 hour  Intake     60 ml  Output    550 ml  Net   -490 ml    EKG:   NSR, RBBB, first degree AV block, no acute ST T wave changes.  11/04/2012  ASSESSMENT AND PLAN:  Complete heart block:  Plan for PPM in am.  Telemetry without further episodes of CH block.  Echo completed and results pending.    Syncope:  See above.  HTN:  BP well controlled.     Fayrene Fearing [facility] 11/04/2012 10:36 AM

## 2012-11-05 ENCOUNTER — Ambulatory Visit (HOSPITAL_COMMUNITY): Admit: 2012-11-05 | Payer: Self-pay | Admitting: Internal Medicine

## 2012-11-05 ENCOUNTER — Encounter (HOSPITAL_COMMUNITY)
Admission: EM | Disposition: A | Discharge status: Home / Self Care | Payer: Self-pay | Source: Home / Self Care | Attending: Cardiology

## 2012-11-05 ENCOUNTER — Inpatient Hospital Stay (HOSPITAL_COMMUNITY): Payer: Medicare Other

## 2012-11-05 DIAGNOSIS — I442 Atrioventricular block, complete: Principal | ICD-10-CM

## 2012-11-05 HISTORY — PX: PERMANENT PACEMAKER INSERTION: SHX5480

## 2012-11-05 HISTORY — PX: INSERT / REPLACE / REMOVE PACEMAKER: SUR710

## 2012-11-05 LAB — PROTIME-INR
INR: 1.06 (ref 0.00–1.49)
Prothrombin Time: 13.7 seconds (ref 11.6–15.2)

## 2012-11-05 SURGERY — PERMANENT PACEMAKER INSERTION
Anesthesia: LOCAL

## 2012-11-05 MED ORDER — ACETAMINOPHEN 325 MG PO TABS
325.0000 mg | ORAL_TABLET | ORAL | Status: DC | PRN
  Administered 2012-11-06 (×2): 650 mg via ORAL
  Filled 2012-11-05 (×2): qty 2

## 2012-11-05 MED ORDER — MIDAZOLAM HCL 5 MG/5ML IJ SOLN
INTRAMUSCULAR | Status: AC
  Filled 2012-11-05: qty 5

## 2012-11-05 MED ORDER — FENTANYL CITRATE 0.05 MG/ML IJ SOLN
INTRAMUSCULAR | Status: AC
  Filled 2012-11-05: qty 2

## 2012-11-05 MED ORDER — CEFAZOLIN SODIUM-DEXTROSE 2-3 GM-% IV SOLR
2.0000 g | Freq: Four times a day (QID) | INTRAVENOUS | Status: AC
  Administered 2012-11-05 – 2012-11-06 (×3): 2 g via INTRAVENOUS
  Filled 2012-11-05 (×3): qty 50

## 2012-11-05 MED ORDER — ONDANSETRON HCL 4 MG/2ML IJ SOLN: 4.0000 mg | Freq: Four times a day (QID) | INTRAMUSCULAR | Status: DC | PRN

## 2012-11-05 MED ORDER — YOU HAVE A PACEMAKER BOOK
Freq: Once | Status: AC
  Administered 2012-11-05: 18:00:00
  Filled 2012-11-05: qty 1

## 2012-11-05 MED ORDER — HEPARIN (PORCINE) IN NACL 2-0.9 UNIT/ML-% IJ SOLN
INTRAMUSCULAR | Status: AC
  Filled 2012-11-05: qty 500

## 2012-11-05 MED ORDER — WHITE PETROLATUM GEL
Status: AC
  Administered 2012-11-05: 0.2
  Filled 2012-11-05: qty 5

## 2012-11-05 MED ORDER — LIDOCAINE HCL (PF) 1 % IJ SOLN
INTRAMUSCULAR | Status: AC
  Filled 2012-11-05: qty 60

## 2012-11-05 NOTE — H&P (Signed)
  HPI: The patient is a 67 year old man who was admitted to the hospital with recurrent syncope. He is referred for evaluation as he was subsequently found to have periods of complete heart block. He has a history of medical problems including liver cancer and colon cancer. He has hypertension. His episodes of syncope and near-syncope have been present for many months. His current episode occurred several days ago. He was added in his yard, and passed out. He sustained a nasal bone fracture. The patient has had intermittent episodes of complete heart block while on telemetry in the hospital. He has been on no AV nodal blocking drugs. He denies chest pain or shortness of breath.  PMH:  Past Medical History   Diagnosis  Date   .  Hypertension    .  Syncope     PSHX:  Past Surgical History   Procedure  Date   .  Colon surgery    .  Liver resection      Liver CA    FAMHX:  Family History   Problem  Relation  Age of Onset   .  Stroke  Mother       40s    .  Stroke  Father  23    Social History: reports that he has been smoking Cigars. He has never used smokeless tobacco. He reports that he drinks alcohol. He reports that he does not use illicit drugs.  Allergies: No Known Allergies  Medications: Reviewed  Dg Chest 2 View  11/05/2012 *RADIOLOGY REPORT* Clinical Data: Syncopal episode. Pre pacemaker. CHEST - 2 VIEW Comparison: None. Findings: The cardiac silhouette is normal in size and configuration. No mediastinal or hilar masses or adenopathy. The lungs are clear. No pleural effusion or pneumothorax. Surgical vascular clips are noted in the right upper quadrant. The bony thorax is demineralized but intact. IMPRESSION: No acute cardiopulmonary disease. Original Report Authenticated By: Amie Portland, M.D.   ROS  As stated in the HPI and negative for all other systems.  Physical Exam  Well appearing NAD  HEENT: Unremarkable except for ecchymoses over the right eye and nasal area  Neck: No  JVD, no thyromegally  Lungs: Clear with no wheezes, rales, or rhonchi.  HEART: Regular rate rhythm, no murmurs, no rubs, no clicks, split S2  Abd: soft, positive bowel sounds, no organomegally, no rebound, no guarding  Ext: 2 plus pulses, no edema, no cyanosis, no clubbing  Skin: No rashes no nodules  Neuro: CN II through XII intact, motor grossly intact  Vitals:Blood pressure 139/72, pulse 75, temperature 98.2 F (36.8 C), temperature source Oral, resp. rate 16, height 5\' 9"  (1.753 m), weight 211 lb 6.7 oz (95.9 kg), SpO2 94.00%.  Assessment/Plan:  1. Stokes-Adams syncope  2. intermittent complete heart block  3. Hypertension  4. facial fracture  Rec: I discussed the treatment options with the patient. He has a clear-cut indication for insertion of a permanent pacemaker. The risk, goals, benefits, and expectations of the procedure have been discussed with the patient in detail and he wishes to proceed.  Sharlot Gowda TaylorMD  11/05/2012, 9:26 AM

## 2012-11-05 NOTE — Consult Note (Signed)
  Reason for Consult:Derek Odonnell attacks  Referring Physician: [name] is an 67 y.o. male.   HPI: The patient is a 67 year old man who was admitted to the hospital with recurrent syncope. He is referred for evaluation as he was subsequently found to have periods of complete heart block. He has a history of medical problems including liver cancer and colon cancer. He has hypertension. His episodes of syncope and near-syncope have been present for many months. His current episode occurred several days ago. He was added in his yard, and passed out. He sustained a nasal bone fracture. The patient has had intermittent episodes of complete heart block while on telemetry in the hospital. He has been on no AV nodal blocking drugs. He denies chest pain or shortness of breath.  PMH: Past Medical History  Diagnosis Date  . Hypertension   . Syncope     PSHX: Past Surgical History  Procedure Date  . Colon surgery   . Liver resection     Liver CA    FAMHX: Family History  Problem Relation Age of Onset  . Stroke Mother     54s  . Stroke Father 74    Social History:  reports that he has been smoking Cigars.  He has never used smokeless tobacco. He reports that he drinks alcohol. He reports that he does not use illicit drugs.  Allergies: No Known Allergies  Medications: Reviewed  Dg Chest 2 View  11/05/2012  *RADIOLOGY REPORT*  Clinical Data: Syncopal episode.  Pre pacemaker.  CHEST - 2 VIEW  Comparison: None.  Findings: The cardiac silhouette is normal in size and configuration.  No mediastinal or hilar masses or adenopathy.  The lungs are clear.  No pleural effusion or pneumothorax.  Surgical vascular clips are noted in the right upper quadrant.  The bony thorax is demineralized but intact.  IMPRESSION: No acute cardiopulmonary disease.   Original Report Authenticated By: Amie Portland, M.D.     ROS  As stated in the HPI and negative for all other systems.  Physical Exam Well  appearing NAD HEENT: Unremarkable except for ecchymoses over the right eye and nasal area Neck:  No JVD, no thyromegally Lungs:  Clear with no wheezes, rales, or rhonchi. HEART:  Regular rate rhythm, no murmurs, no rubs, no clicks, split S2 Abd:  soft, positive bowel sounds, no organomegally, no rebound, no guarding Ext:  2 plus pulses, no edema, no cyanosis, no clubbing Skin:  No rashes no nodules Neuro:  CN II through XII intact, motor grossly intact  Vitals:Blood pressure 139/72, pulse 75, temperature 98.2 F (36.8 C), temperature source Oral, resp. rate 16, height 5\' 9"  (1.753 m), weight 211 lb 6.7 oz (95.9 kg), SpO2 94.00%.   Assessment/Plan: 1. Derek Odonnell syncope 2. intermittent complete heart block 3. Hypertension 4. facial fracture Rec: I discussed the treatment options with the patient. He has a clear-cut indication for insertion of a permanent pacemaker. The risk, goals, benefits, and expectations of the procedure have been discussed with the patient in detail and he wishes to proceed.  Sharlot Gowda TaylorMD 11/05/2012, 9:26 AM

## 2012-11-05 NOTE — Op Note (Signed)
DDD PPM insertion via the left subclavian/cephalic vein. [id number].

## 2012-11-05 NOTE — Discharge Summary (Signed)
ELECTROPHYSIOLOGY DISCHARGE SUMMARY    Patient ID: Derek Odonnell,  MRN: [id number], DOB/AGE: Jul 27, 1946 67 y.o.  Admit date: 11/02/2012 Discharge date: 11/06/2012  Primary Care Physician: Derek Price, MD Primary Cardiologist: Derek Ridgel, MD  Primary Discharge Diagnosis:  1. Complete heart block s/p dual chamber PPM implantation 2. Syncope, most likely due to #1  Secondary Discharge Diagnoses:  1. HTN 2. 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 Dual chamber PPM implantation 11/05/2012 [employer] dual chamber pacemaker serial number [id number]   History and Hospital Course:  Derek Odonnell is a 67 year old gentleman with HTN and prior colon CA with metastases to liver s/p resection several years ago who was admitted after syncopal episode. He sustained significant facial trauma although his head CT was negative for intracranial abnormality. On admission he was found to have an abnormal ECG with first degree AV block and RBBB. He reported "lightheadedness" intermittently for years but no frank syncope until the day of admission. He was admitted to telemetry. An echocardiogram was done revealing normal LV function with no wall motion abnormalities and no significant valvular abnormalities. His CEs were negative. While on telemetry, he experienced complete heart block with rates in the 20s accompanied by "lightheadedness" therefore, he underwent dual chamber PPM implantation on 11/05/2012. Derek Odonnell tolerated this procedure well without any immediate complication. He remains hemodynamically stable and afebrile. His chest xray shows stable lead placement without pneumothorax. His device interrogation this AM was reviewed by Dr. Johney Odonnell and shows normal PPM function with stable lead parameters/measurements. His implant site is intact without significant bleeding or hematoma. He has been given discharge instructions including wound care and  activity restrictions. He will follow-up in 10 days for wound check. There were no changes made to his medications. He has been seen, examined and deemed stable for discharge today by Dr. Hillis Range. He was instructed not to drive for 6 months, until given clearance by Dr. Lewayne Odonnell.   Physical Exam: Vitals: Blood pressure 154/60, pulse 71, temperature 98.5 F (36.9 C), temperature source Oral, resp. rate 17, height 5\' 9"  (1.753 m), weight 211 lb 6.7 oz (95.9 kg), SpO2 94.00%.  General: WD, well appearing 66 year old male in no acute distress Heart: RRR. S1, S2 without murmur, rub or gallop. Lungs: CTA bilaterally. No wheezes, rales or rhonchi. Abdomen: Soft, nondistended. Extremities: No cyanosis, clubbing or edema. Neuro: A & O x 3. No focal deficits. Skin: Left upper chest/implant site intact without bleeding or hematoma.  Labs: Lab Results  Component Value Date   WBC 8.7 11/02/2012   HGB 15.5 11/02/2012   HCT 44.1 11/02/2012   MCV 92.1 11/02/2012   PLT 163 11/02/2012     Lab 11/03/12 0521  NA 136  K 4.1  CL 99  CO2 23  BUN 15  CREATININE 0.76  CALCIUM 8.8  PROT 6.9  BILITOT 1.4*  ALKPHOS 52  ALT 32  AST 28  GLUCOSE 113*   Lab Results  Component Value Date   TROPONINI <0.30 11/03/2012     Basename 11/05/12 0430  INR 1.06    Chest x-ray: CHEST - 2 VIEW Comparison: 11/05/2012  Findings: Dual lead pacer has been inserted. No pneumothorax. Heart size and vascularity are normal. The lungs are clear. No acute osseous abnormality. IMPRESSION: No acute abnormality. New pacer in place.   Disposition:  The patient is being discharged in stable condition.  Follow-up:  Follow-up Information    Follow up with [facility] On 11/14/2012. (At 9:30 AM for wound check)    Contact information:   [address] [place] [address] [phone]      Follow up with Derek Bunting, MD. On 02/05/2013. (At 9:00 AM for pacemaker follow-up)     Contact information:   [address] [place] [address] [phone]       Discharge Medications:    Medication List     As of 11/06/2012  8:07 AM    TAKE these medications         lisinopril 10 MG tablet   Commonly known as: PRINIVIL,ZESTRIL   Take 15 mg by mouth daily.      MAGNESIUM PO   Take 1 capsule by mouth daily. OTC      multivitamin with minerals Tabs   Take 1 tablet by mouth daily.      POTASSIUM PO   Take 1 tablet by mouth daily. OTC      VITAMIN C PO   Take 1 tablet by mouth daily. OTC       Duration of Discharge Encounter: Greater than 30 minutes including physician time.  Signed, Rick Duff, PA-C 11/06/2012, 8:07 AM   Agree with above I have been very clear that the patient cannot drive until he is cleared by Dr Derek Odonnell.  He voices willingness to comply. Hillis Range, MD

## 2012-11-06 ENCOUNTER — Inpatient Hospital Stay (HOSPITAL_COMMUNITY): Payer: Medicare Other

## 2012-11-06 NOTE — Op Note (Signed)
NAMEELBERT, SPICKLER NO.:  [id number]  MEDICAL RECORD NO.:  [id number]  LOCATION:  6525                         FACILITY:  [facility]  PHYSICIAN:  Doylene Canning. Ladona Ridgel, MD    DATE OF BIRTH:  [birth year]  DATE OF PROCEDURE:  11/05/2012 DATE OF DISCHARGE:                              OPERATIVE REPORT   PROCEDURE PERFORMED:  Insertion of a dual-chamber pacemaker.  INDICATION:  Intermittent complete heart block with syncope.  The patient is a 67 year old male with recurrent episodes of dizziness and lightheadedness who was admitted to the hospital with a syncopal episode.  He sustained a nasal fracture.  The patient was found on telemetry to have intermittent complete heart block, and is now referred for permanent pacemaker insertion.  PROCEDURE IN DETAIL:  After informed consent was obtained, the patient was taken to the diagnostic EP lab in a fasting state.  After usual preparation and draping, intravenous fentanyl and midazolam were given for sedation.  A 30 mL of lidocaine was infiltrated into the left infraclavicular region.  A 5-cm incision was carried over this region. Electrocautery was utilized to dissect down to the fascial plane.  Blunt dissection was then utilized to free up the cephalic vein and isolated. The St. Jude model 2088 T 58-cm active fixation pacing lead, serial number [id number] was advanced by way of the left cephalic vein into the right atrium.  Attempts to advance the atrial lead through the same cephalic vein were unsuccessful.  Attempts to puncture the left subclavian vein was then carried out, and also unsuccessful.  Venography of the left upper extremity venous system was carried out, which demonstrated that the subclavian vein was displaced caudally.  It was then successfully punctured.  The atrial lead was advanced into the right atrium.  Initially, the right ventricular lead was advanced into the right ventricle and mapping was carried  out.  R-waves measured 14 mV.  The lead was actively fixed.  The pacing threshold was 2 V at 0.4 milliseconds with a large injury current during the active fixation of the lead.  The pacing impedance was 1000 ohms, 10 V pacing was not stimulate the diaphragm.  There was a large injury current with active fixation lead.  With these satisfactory parameters, attention was then turned to the atrial lead, was placed in anterolateral portion of the right atrium.  The P-waves measured 6 mV.  The pacing impedance was 500 ohms and the threshold was 1 V at 0.4 milliseconds.  Again, a large injury current was present and with active fixation of the lead, diaphragmatic stimulation was not present.  With these satisfactory parameters, the leads were secured to the subpectoral fascia with a figure-of-eight silk suture.  Sewing sleeve was secured with silk suture.  Electrocautery was utilized to make a subcutaneous pocket. Antibiotic irrigation was utilized to irrigate the pocket and electrocautery was utilized to assure hemostasis.  The St. Jude dual- chamber pacemaker serial number [id number] was connected to the atrial and RV leads and placed back in the subcutaneous pocket.  The pocket was irrigated with antibiotic irrigation.  The incision was closed with 2-0 and 3-0 Vicryl.  Benzoin and Steri-Strips were painted on the skin.  A pressure dressing applied and the patient was returned to his room in satisfactory condition.  There were no immediate procedure complications for results that demonstrate successful implantation of a St. Jude dual-chamber pacemaker in a patient with symptomatic intermittent complete heart block.     Doylene Canning. Ladona Ridgel, MD     GWT/MEDQ  D:  11/05/2012  T:  11/06/2012  Job:  [id number]

## 2012-11-07 NOTE — Op Note (Signed)
Noted  

## 2012-11-14 ENCOUNTER — Ambulatory Visit (INDEPENDENT_AMBULATORY_CARE_PROVIDER_SITE_OTHER): Payer: Medicare Other | Admitting: *Deleted

## 2012-11-14 ENCOUNTER — Encounter: Payer: Self-pay | Admitting: Internal Medicine

## 2012-11-14 DIAGNOSIS — I442 Atrioventricular block, complete: Secondary | ICD-10-CM

## 2012-11-14 DIAGNOSIS — R55 Syncope and collapse: Secondary | ICD-10-CM

## 2012-11-14 LAB — PACEMAKER DEVICE OBSERVATION
ATRIAL PACING PM: 1
BAMS-0003: 70 {beats}/min
BRDY-0002RV: 60 {beats}/min
BRDY-0004RV: 120 {beats}/min
DEVICE MODEL PM: 7441572
RV LEAD THRESHOLD: 0.75 V
VENTRICULAR PACING PM: 15

## 2012-11-14 NOTE — Progress Notes (Signed)
Wound check-PPM.  New A-fib.  Patient was started on ASA 325mg  daily per Dr. Ladona Ridgel.  ROV 12/03/12 with Dr. Ladona Ridgel.

## 2012-11-15 ENCOUNTER — Telehealth: Payer: Self-pay | Admitting: Internal Medicine

## 2012-11-15 NOTE — Telephone Encounter (Addendum)
Called patient back. He wanted to know what type of exercise he could participate in. Advised stationary bicycle is OK but no golf for now and no heavy lifting. He states that when he had his wound check yesterday he was asked if he ever had atrial fib before. Patient concerned about this this because he was not told about AF while he was in the hospital. Previous EKG's show NSR with heart block. Advised will ask Gunnar Fusi ( nurse who checked his device) and call him back.   Spoke with Delsa Grana and she advised that his Merlin showed that he had been in atrial fib. This was discussed with Dr.Taylor yesterday and he was started on ASA 325mg  every day. Patient has agreed that he is taking the ASA every day

## 2012-11-15 NOTE — Telephone Encounter (Signed)
Pt would like to know what he can and can not do

## 2012-11-19 ENCOUNTER — Telehealth: Payer: Self-pay | Admitting: Internal Medicine

## 2012-11-19 NOTE — Telephone Encounter (Signed)
Pt would like to know what his limitations are after implant, he is two weeks out at this point pls call

## 2012-11-19 NOTE — Telephone Encounter (Signed)
Spoke with patient and answered his questions in regards to lifinting eights.  Needs to wait 2 more weeks before he starts

## 2012-12-03 ENCOUNTER — Encounter: Payer: Medicare Other | Admitting: Internal Medicine

## 2012-12-03 ENCOUNTER — Telehealth: Payer: Self-pay | Admitting: Internal Medicine

## 2012-12-03 NOTE — Telephone Encounter (Signed)
Pt wants refill of lisinopril for 6 mos to [facility]

## 2012-12-04 MED ORDER — LISINOPRIL 10 MG PO TABS: 15.0000 mg | ORAL_TABLET | Freq: Every day | ORAL | Status: DC

## 2012-12-05 ENCOUNTER — Telehealth: Payer: Self-pay | Admitting: Internal Medicine

## 2012-12-05 NOTE — Telephone Encounter (Signed)
Spoke with patient who had questions about our new leadless ppm.

## 2012-12-05 NOTE — Telephone Encounter (Signed)
Pt saw an article in paper re wireless lead, he is interested in this, pls call [phone], has questions

## 2012-12-12 ENCOUNTER — Telehealth: Payer: Self-pay | Admitting: Internal Medicine

## 2012-12-12 NOTE — Telephone Encounter (Signed)
Spoke to patient he stated he has swelling in left hand for the past 2 days.States hand is not painful just has a weak grip.States recently had a pacemaker implant.States he has put a ice pack on hand and swelling is alittle better today.Message sent to Dr.Taylor's nurse.

## 2012-12-12 NOTE — Telephone Encounter (Signed)
Pt having some swelling in his left hand and wants to talk to someone because he is not sure what it might be trying to come in and be seen today

## 2012-12-13 NOTE — Telephone Encounter (Signed)
Patient has an appointment on Mon with Dr Ladona Ridgel

## 2012-12-17 ENCOUNTER — Encounter: Payer: Self-pay | Admitting: Internal Medicine

## 2012-12-17 ENCOUNTER — Ambulatory Visit (INDEPENDENT_AMBULATORY_CARE_PROVIDER_SITE_OTHER): Payer: Medicare Other | Admitting: Internal Medicine

## 2012-12-17 VITALS — BP 130/90 | HR 73

## 2012-12-17 DIAGNOSIS — I442 Atrioventricular block, complete: Secondary | ICD-10-CM

## 2012-12-17 DIAGNOSIS — Z950 Presence of cardiac pacemaker: Secondary | ICD-10-CM

## 2012-12-17 DIAGNOSIS — I4891 Unspecified atrial fibrillation: Secondary | ICD-10-CM

## 2012-12-17 LAB — PACEMAKER DEVICE OBSERVATION
ATRIAL PACING PM: 1
BAMS-0001: 170 {beats}/min
BAMS-0003: 70 {beats}/min
BRDY-0004RV: 120 {beats}/min
DEVICE MODEL PM: 7441572
RV LEAD AMPLITUDE: 6.1 mv
RV LEAD IMPEDENCE PM: 760 Ohm
RV LEAD THRESHOLD: 0.75 V

## 2012-12-17 NOTE — Assessment & Plan Note (Signed)
His St. Jude dual-chamber pacemaker is working normally. We'll plan to recheck in several months. 

## 2012-12-17 NOTE — Progress Notes (Signed)
HPI Mr. Ozment returns today for followup. He is a very pleasant 67 year old man with symptomatic bradycardia and atrial fibrillation, status post permanent pacemaker insertion. The patient admits to some mild exercise dyspnea. He denies chest pain, syncope, or peripheral edema. No Known Allergies   Current Outpatient Prescriptions  Medication Sig Dispense Refill  . Ascorbic Acid (VITAMIN C PO) Take 1 tablet by mouth daily. OTC      . aspirin 325 MG buffered tablet Take 1 tablet (325 mg total) by mouth daily.      Marland Kitchen lisinopril (PRINIVIL,ZESTRIL) 10 MG tablet Take 1.5 tablets (15 mg total) by mouth daily.  45 tablet  2  . MAGNESIUM PO Take 1 capsule by mouth daily. OTC      . Multiple Vitamin (MULTIVITAMIN WITH MINERALS) TABS Take 1 tablet by mouth daily.      Marland Kitchen POTASSIUM PO Take 1 tablet by mouth daily. OTC       No current facility-administered medications for this visit.     Past Medical History  Diagnosis Date  . Hypertension   . Syncope     ROS:   All systems reviewed and negative except as noted in the HPI.   Past Surgical History  Procedure Laterality Date  . Colon surgery    . Liver resection      Liver CA     Family History  Problem Relation Age of Onset  . Stroke Mother     62s  . Stroke Father 66     History   Social History  . Marital Status: Married    Spouse Name: N/A    Number of Children: N/A  . Years of Education: N/A   Occupational History  . Not on file.   Social History Main Topics  . Smoking status: Light Tobacco Smoker    Types: Cigars  . Smokeless tobacco: Never Used  . Alcohol Use: Yes     Comment: very rarely  . Drug Use: No  . Sexually Active:    Other Topics Concern  . Not on file   Social History Narrative  . No narrative on file     BP 130/90  Pulse 73  SpO2 97%  Physical Exam:  Well appearing 67 year old man, NAD HEENT: Unremarkable Neck:  7 cm JVD, no thyromegally Lungs:  Clear with no wheezes, rales, or  rhonchi HEART:  IRegular rate rhythm, no murmurs, no rubs, no clicks Abd:  soft, positive bowel sounds, no organomegally, no rebound, no guarding Ext:  2 plus pulses, no edema, no cyanosis, no clubbing Skin:  No rashes no nodules Neuro:  CN II through XII intact, motor grossly intact   DEVICE  Normal device function.  See PaceArt for details.   Assess/Plan:

## 2012-12-17 NOTE — Patient Instructions (Addendum)
Your physician has requested that you have an exercise tolerance test. For further information please visit [email or web address]. Please also follow instruction sheet, as given.    Exercise Stress Electrocardiography An exercise stress test is a heart test (EKG) which is done while you are moving. You will walk on a treadmill. This test will tell your doctor how your heart does when it is forced to work harder and how much activity you can safely handle. BEFORE THE TEST  Wear shorts or athletic pants.  Wear comfortable tennis shoes.  Women need to wear a bra that allows patches to be put on under it. TEST  An EKG cable will be attached to your waist. This cable is hooked up to patches, which look like round stickers stuck to your chest.  You will be asked to walk on the treadmill.  You will walk until you are too tired or until you are told to stop.  Tell the doctor right away if you have:  Chest pain.  Leg cramps.  Shortness of breath.  Dizziness.  The test may last 30 minutes to 1 hour. The timing depends on your physical condition and the condition of your heart. AFTER THE TEST  You will rest for about 6 minutes. During this time, your heart rhythm and blood pressure will be checked.  The testing equipment will be removed from your body and you can get dressed.  You may go home or back to your hospital room. You may keep doing all your usual activities as told by your doctor. Finding out the results of your test Ask when your test results will be ready. Make sure you get your test results. Document Released: 03/14/2008 Document Revised: 12/19/2011 Document Reviewed: 03/14/2008 ExitCare Patient Information 2013 [employer].  

## 2012-12-17 NOTE — Assessment & Plan Note (Signed)
The patient has intermittent heart block. His ventricular rate is currently well-controlled.

## 2012-12-17 NOTE — Assessment & Plan Note (Signed)
It is unclear whether his ventricular rate is controlled or not. He is persistently in atrial fibrillation since his last visit. His stroke risk is 1 with hypertension. I've asked the patient undergo exercise treadmill testing to better evaluate his chronotropic response. Consideration of antiarrhythmic drug therapy will will be made based on his response to exercise.

## 2013-01-01 ENCOUNTER — Ambulatory Visit (INDEPENDENT_AMBULATORY_CARE_PROVIDER_SITE_OTHER): Payer: Medicare Other | Admitting: Internal Medicine

## 2013-01-01 DIAGNOSIS — I442 Atrioventricular block, complete: Secondary | ICD-10-CM

## 2013-01-01 NOTE — Progress Notes (Signed)
Exercise Treadmill Test  Pre-Exercise Testing Evaluation Rhythm: atrial fibrillation  Rate: 71                 Test  Exercise Tolerance Test Ordering MD: Lewayne Bunting, MD  Interpreting MD: Lewayne Bunting, MD  Unique Test No: 1  Treadmill:  1  Indication for ETT: A fib  Contraindication to ETT: No   Stress Modality: exercise - treadmill  Cardiac Imaging Performed: non   Protocol: Naughton - symptom limited  Max BP:  171/83  Max MPHR (bpm):  152 85% MPR (bpm):  129  MPHR obtained (bpm):  84 % MPHR obtained:  55  Reached 85% MPHR (min:sec):  n/a Total Exercise Time (min-sec):  6;00 naug  Workload in METS:  4.3 Borg Scale: 13  Reason ETT Terminated:  patient's desire to stop    ST Segment Analysis At Rest: normal ST segments - no evidence of significant ST depression With Exercise: borderline ST changes  Other Information Arrhythmia:  No Angina during ETT:  absent (0) Quality of ETT:  non-diagnostic  ETT Interpretation:  borderline (indeterminate) with non-specific ST changes  Comments: Unable to evaluate for ischemia due to baseline ventricular pacing  Recommendations: May need uptitration of rate response. Will encourage physical activity.

## 2013-02-05 ENCOUNTER — Encounter: Payer: Medicare Other | Admitting: Internal Medicine

## 2013-02-06 ENCOUNTER — Encounter: Payer: Self-pay | Admitting: Internal Medicine

## 2013-03-05 ENCOUNTER — Telehealth: Payer: Self-pay | Admitting: Internal Medicine

## 2013-03-05 MED ORDER — LISINOPRIL 10 MG PO TABS: 15.0000 mg | ORAL_TABLET | Freq: Every day | ORAL | Status: DC

## 2013-03-05 NOTE — Telephone Encounter (Signed)
New problem   Pt calling about a prescription

## 2014-01-23 ENCOUNTER — Other Ambulatory Visit: Payer: Self-pay

## 2014-01-23 MED ORDER — LISINOPRIL 10 MG PO TABS: 15.0000 mg | ORAL_TABLET | Freq: Every day | ORAL | Status: DC

## 2014-02-12 ENCOUNTER — Telehealth: Payer: Self-pay

## 2014-02-12 NOTE — Telephone Encounter (Signed)
SPOKE WITH THE PATIENT ABOUT REASON FOR APPT. PATIENT STATES IT IS HIS YEARLY CHECKUO SINCE PACER WAS PLACED.

## 2014-02-20 ENCOUNTER — Ambulatory Visit (INDEPENDENT_AMBULATORY_CARE_PROVIDER_SITE_OTHER): Payer: Medicare Other | Admitting: Internal Medicine

## 2014-02-20 ENCOUNTER — Encounter: Payer: Self-pay | Admitting: Internal Medicine

## 2014-02-20 VITALS — BP 110/68 | HR 70 | Ht 69.0 in | Wt 203.0 lb

## 2014-02-20 DIAGNOSIS — I4891 Unspecified atrial fibrillation: Secondary | ICD-10-CM

## 2014-02-20 DIAGNOSIS — I442 Atrioventricular block, complete: Secondary | ICD-10-CM

## 2014-02-20 DIAGNOSIS — Z950 Presence of cardiac pacemaker: Secondary | ICD-10-CM

## 2014-02-20 DIAGNOSIS — I10 Essential (primary) hypertension: Secondary | ICD-10-CM

## 2014-02-20 MED ORDER — LISINOPRIL 10 MG PO TABS: 10.0000 mg | ORAL_TABLET | Freq: Every day | ORAL | Status: DC

## 2014-02-20 NOTE — Assessment & Plan Note (Signed)
His St. Jude DDD PM is working normally. Will recheck in several months. 

## 2014-02-20 NOTE — Patient Instructions (Signed)
Your physician wants you to follow-up in: 12 months with Dr Knox Saliva will receive a reminder letter in the mail two months in advance. If you don't receive a letter, please call our office to schedule the follow-up appointment.   Remote monitoring is used to monitor your Pacemaker or ICD from home. This monitoring reduces the number of office visits required to check your device to one time per year. It allows [place] to keep an eye on the functioning of your device to ensure it is working properly. You are scheduled for a device check from home on 05/26/14. You may send your transmission at any time that day. If you have a wireless device, the transmission will be sent automatically. After your physician reviews your transmission, you will receive a postcard with your next transmission date.  Your physician has recommended you make the following change in your medication:  1) Decrease Lisinopril to 10mg  daily

## 2014-02-20 NOTE — Assessment & Plan Note (Signed)
The blood pressure is well controlled. Continue current meds.

## 2014-02-20 NOTE — Assessment & Plan Note (Signed)
He is chronically in atrial fib. He will continue his current meds. We discussed the possibility of using systemic anti-coagulation because of his age and HTN but the patient refuses to take coumadin or a NOAC. "my risk is small"

## 2014-02-20 NOTE — Progress Notes (Signed)
HPI [name] returns today for followup. He is a very pleasant 68 year old man with symptomatic bradycardia and atrial fibrillation, due to complete heart block, status post permanent pacemaker insertion. The patient admits to some mild exercise dyspnea. He denies chest pain, syncope, or peripheral edema. He has very mild weakness, especially when his blood pressure is running low. No Known Allergies   Current Outpatient Prescriptions  Medication Sig Dispense Refill  . Ascorbic Acid (VITAMIN C PO) Take 1 tablet by mouth daily. OTC      . aspirin 325 MG buffered tablet Take 1 tablet (325 mg total) by mouth daily.      Marland Kitchen lisinopril (PRINIVIL,ZESTRIL) 10 MG tablet Take 1 tablet (10 mg total) by mouth daily.  90 tablet  3  . MAGNESIUM PO Take 1 capsule by mouth daily. OTC      . Multiple Vitamin (MULTIVITAMIN WITH MINERALS) TABS Take 1 tablet by mouth daily.      Marland Kitchen POTASSIUM PO Take 1 tablet by mouth daily. OTC       No current facility-administered medications for this visit.     Past Medical History  Diagnosis Date  . Hypertension   . Syncope   . CHB (complete heart block)   . Atrial fibrillation   . Syncope and collapse     ROS:   All systems reviewed and negative except as noted in the HPI.   Past Surgical History  Procedure Laterality Date  . Colon surgery    . Liver resection      Liver CA     Family History  Problem Relation Age of Onset  . Stroke Mother     69s  . Stroke Father 29     History   Social History  . Marital Status: Married    Spouse Name: N/A    Number of Children: N/A  . Years of Education: N/A   Occupational History  . Not on file.   Social History Main Topics  . Smoking status: Light Tobacco Smoker    Types: Cigars  . Smokeless tobacco: Never Used  . Alcohol Use: Yes     Comment: very rarely  . Drug Use: No  . Sexual Activity:    Other Topics Concern  . Not on file   Social History Narrative  . No narrative on file     BP  110/68  Pulse 70  Ht 5\' 9"  (1.753 m)  Wt 203 lb (92.08 kg)  BMI 29.96 kg/m2  Physical Exam:  Well appearing 68 year old man, NAD HEENT: Unremarkable Neck:  7 cm JVD, no thyromegally Lungs:  Clear with no wheezes, rales, or rhonchi HEART:  IRegular rate rhythm, no murmurs, no rubs, no clicks Abd:  soft, positive bowel sounds, no organomegally, no rebound, no guarding Ext:  2 plus pulses, no edema, no cyanosis, no clubbing Skin:  No rashes no nodules Neuro:  CN II through XII intact, motor grossly intact   DEVICE  Normal device function.  See PaceArt for details.   Assess/Plan:

## 2014-02-21 LAB — MDC_IDC_ENUM_SESS_TYPE_INCLINIC
Battery Voltage: 2.99 V
Brady Statistic RA Percent Paced: 0 %
Brady Statistic RV Percent Paced: 96 %
Implantable Pulse Generator Model: 2210
Lead Channel Impedance Value: 400 Ohm
Lead Channel Impedance Value: 812.5 Ohm
Lead Channel Pacing Threshold Pulse Width: 0.5 ms
Lead Channel Sensing Intrinsic Amplitude: 4 mV
Lead Channel Setting Pacing Amplitude: 2.5 V
Lead Channel Setting Pacing Amplitude: 3.5 V
Lead Channel Setting Pacing Pulse Width: 0.5 ms
MDC IDC MSMT BATTERY REMAINING LONGEVITY: 127.2 mo
MDC IDC MSMT LEADCHNL RV PACING THRESHOLD AMPLITUDE: 1 V
MDC IDC MSMT LEADCHNL RV SENSING INTR AMPL: 8.8 mV
MDC IDC PG SERIAL: 7441572
MDC IDC SESS DTM: 20150514153941
MDC IDC SET LEADCHNL RV SENSING SENSITIVITY: 2 mV

## 2014-02-24 ENCOUNTER — Telehealth: Payer: Self-pay | Admitting: *Deleted

## 2014-02-24 NOTE — Telephone Encounter (Signed)
Okay to take 15mg  daily

## 2014-02-24 NOTE — Telephone Encounter (Signed)
Patient aware.

## 2014-02-24 NOTE — Telephone Encounter (Signed)
Patient called and stated that he would like to go back to taking the lisinopril 15mg  as he was previously taking. Please advise. Thanks, MI

## 2014-05-08 ENCOUNTER — Encounter (HOSPITAL_COMMUNITY): Payer: Self-pay | Admitting: Emergency Medicine

## 2014-05-08 ENCOUNTER — Telehealth: Payer: Self-pay | Admitting: Internal Medicine

## 2014-05-08 ENCOUNTER — Emergency Department (HOSPITAL_COMMUNITY): Payer: Medicare Other

## 2014-05-08 ENCOUNTER — Inpatient Hospital Stay (HOSPITAL_COMMUNITY)
Admission: EM | Admit: 2014-05-08 | Discharge: 2014-05-09 | DRG: 292 | Disposition: A | Discharge status: Home / Self Care | Payer: Medicare Other | Attending: Cardiology | Admitting: Cardiology

## 2014-05-08 DIAGNOSIS — I4891 Unspecified atrial fibrillation: Secondary | ICD-10-CM

## 2014-05-08 DIAGNOSIS — I509 Heart failure, unspecified: Secondary | ICD-10-CM

## 2014-05-08 DIAGNOSIS — Z950 Presence of cardiac pacemaker: Secondary | ICD-10-CM | POA: Diagnosis not present

## 2014-05-08 DIAGNOSIS — J9819 Other pulmonary collapse: Secondary | ICD-10-CM | POA: Diagnosis present

## 2014-05-08 DIAGNOSIS — Z9089 Acquired absence of other organs: Secondary | ICD-10-CM | POA: Diagnosis not present

## 2014-05-08 DIAGNOSIS — I428 Other cardiomyopathies: Secondary | ICD-10-CM | POA: Diagnosis present

## 2014-05-08 DIAGNOSIS — Z85038 Personal history of other malignant neoplasm of large intestine: Secondary | ICD-10-CM | POA: Diagnosis not present

## 2014-05-08 DIAGNOSIS — M25819 Other specified joint disorders, unspecified shoulder: Secondary | ICD-10-CM | POA: Diagnosis present

## 2014-05-08 DIAGNOSIS — Z8505 Personal history of malignant neoplasm of liver: Secondary | ICD-10-CM | POA: Diagnosis not present

## 2014-05-08 DIAGNOSIS — M7580 Other shoulder lesions, unspecified shoulder: Secondary | ICD-10-CM

## 2014-05-08 DIAGNOSIS — R55 Syncope and collapse: Secondary | ICD-10-CM

## 2014-05-08 DIAGNOSIS — F17200 Nicotine dependence, unspecified, uncomplicated: Secondary | ICD-10-CM | POA: Diagnosis present

## 2014-05-08 DIAGNOSIS — I10 Essential (primary) hypertension: Secondary | ICD-10-CM | POA: Diagnosis present

## 2014-05-08 DIAGNOSIS — Z9049 Acquired absence of other specified parts of digestive tract: Secondary | ICD-10-CM | POA: Diagnosis not present

## 2014-05-08 DIAGNOSIS — I5021 Acute systolic (congestive) heart failure: Secondary | ICD-10-CM | POA: Diagnosis present

## 2014-05-08 DIAGNOSIS — I480 Paroxysmal atrial fibrillation: Secondary | ICD-10-CM

## 2014-05-08 HISTORY — DX: Other specified joint disorders, unspecified shoulder: M25.819

## 2014-05-08 HISTORY — DX: Presence of cardiac pacemaker: Z95.0

## 2014-05-08 HISTORY — DX: Malignant neoplasm of colon, unspecified: C18.9

## 2014-05-08 HISTORY — DX: Unspecified atrial fibrillation: I48.91

## 2014-05-08 HISTORY — DX: Impingement syndrome of unspecified shoulder: M75.40

## 2014-05-08 HISTORY — DX: Secondary malignant neoplasm of liver and intrahepatic bile duct: C78.7

## 2014-05-08 LAB — CBC
HEMATOCRIT: 49 % (ref 39.0–52.0)
Hemoglobin: 17.1 g/dL — ABNORMAL HIGH (ref 13.0–17.0)
MCH: 32.5 pg (ref 26.0–34.0)
MCHC: 34.9 g/dL (ref 30.0–36.0)
MCV: 93.2 fL (ref 78.0–100.0)
PLATELETS: 129 10*3/uL — AB (ref 150–400)
RBC: 5.26 MIL/uL (ref 4.22–5.81)
RDW: 13.9 % (ref 11.5–15.5)
WBC: 8.3 10*3/uL (ref 4.0–10.5)

## 2014-05-08 LAB — BASIC METABOLIC PANEL
Anion gap: 18 — ABNORMAL HIGH (ref 5–15)
BUN: 20 mg/dL (ref 6–23)
CALCIUM: 9.2 mg/dL (ref 8.4–10.5)
CHLORIDE: 98 meq/L (ref 96–112)
CO2: 20 meq/L (ref 19–32)
Creatinine, Ser: 0.84 mg/dL (ref 0.50–1.35)
GFR calc Af Amer: >90 mL/min (ref >90)
GFR calc non Af Amer: 87 mL/min — ABNORMAL LOW (ref >90)
Glucose, Bld: 102 mg/dL — ABNORMAL HIGH (ref 70–99)
Potassium: 4.4 mEq/L (ref 3.7–5.3)
Sodium: 136 mEq/L — ABNORMAL LOW (ref 137–147)

## 2014-05-08 LAB — I-STAT TROPONIN, ED: Troponin i, poc: 0.04 ng/mL (ref 0.00–0.08)

## 2014-05-08 LAB — PRO B NATRIURETIC PEPTIDE: PRO B NATRI PEPTIDE: 2923 pg/mL — AB (ref 0–125)

## 2014-05-08 MED ORDER — FUROSEMIDE 10 MG/ML IJ SOLN
40.0000 mg | Freq: Two times a day (BID) | INTRAMUSCULAR | Status: DC
  Administered 2014-05-09: 40 mg via INTRAVENOUS
  Filled 2014-05-08 (×2): qty 4

## 2014-05-08 MED ORDER — SODIUM CHLORIDE 0.9 % IJ SOLN
3.0000 mL | Freq: Two times a day (BID) | INTRAMUSCULAR | Status: DC
  Administered 2014-05-09: 3 mL via INTRAVENOUS

## 2014-05-08 MED ORDER — SODIUM CHLORIDE 0.9 % IJ SOLN: 3.0000 mL | INTRAMUSCULAR | Status: DC | PRN

## 2014-05-08 MED ORDER — LISINOPRIL 5 MG PO TABS
15.0000 mg | ORAL_TABLET | Freq: Every day | ORAL | Status: DC
  Administered 2014-05-09: 15 mg via ORAL
  Filled 2014-05-08: qty 1

## 2014-05-08 MED ORDER — HEPARIN SODIUM (PORCINE) 5000 UNIT/ML IJ SOLN
5000.0000 [IU] | Freq: Three times a day (TID) | INTRAMUSCULAR | Status: DC
  Filled 2014-05-08 (×4): qty 1

## 2014-05-08 MED ORDER — ASPIRIN 325 MG PO TABS
325.0000 mg | ORAL_TABLET | Freq: Every day | ORAL | Status: DC
  Administered 2014-05-09: 325 mg via ORAL
  Filled 2014-05-08: qty 1

## 2014-05-08 MED ORDER — ONDANSETRON HCL 4 MG/2ML IJ SOLN: 4.0000 mg | Freq: Four times a day (QID) | INTRAMUSCULAR | Status: DC | PRN

## 2014-05-08 MED ORDER — ASPIRIN BUFFERED 325 MG PO TABS: 325.0000 mg | ORAL_TABLET | Freq: Every day | ORAL | Status: DC

## 2014-05-08 MED ORDER — ACETAMINOPHEN 325 MG PO TABS: 650.0000 mg | ORAL_TABLET | ORAL | Status: DC | PRN

## 2014-05-08 MED ORDER — SODIUM CHLORIDE 0.9 % IV SOLN: 250.0000 mL | INTRAVENOUS | Status: DC | PRN

## 2014-05-08 MED ORDER — FUROSEMIDE 10 MG/ML IJ SOLN
40.0000 mg | Freq: Once | INTRAMUSCULAR | Status: AC
Start: 2014-05-08 — End: 2014-05-08
  Administered 2014-05-08: 40 mg via INTRAVENOUS
  Filled 2014-05-08: qty 4

## 2014-05-08 MED ORDER — IOHEXOL 300 MG/ML  SOLN
80.0000 mL | Freq: Once | INTRAMUSCULAR | Status: AC | PRN
  Administered 2014-05-08: 80 mL via INTRAVENOUS

## 2014-05-08 NOTE — H&P (Signed)
Patient ID: Derek Odonnell MRN: [id number], DOB/AGE: March 18, 1946   Admit date: 05/08/2014   Primary Physician: Default, Provider, MD Primary Cardiologist: G. Lovena Le, MD   Pt. Profile:  68 y/o male with a h/o PAF and CHB s/p PPM who presented to the ED today with a 1 wk h/o progressive dyspnea.  Problem List  Past Medical History  Diagnosis Date  . Hypertension   . Syncope     a. 10/2012 Echo: EF 60-65%, no reg wma, mild MR, mildly dil LA.  . CHB (complete heart block)     a. 10/2012 s/p SJM [address] DR RF DC PPM, ser # [id number].  Marland Kitchen A-fib     Past Surgical History  Procedure Laterality Date  . Colon surgery    . Liver resection      Liver CA     Allergies  No Known Allergies  HPI  68 y/o male with a h/o syncope in the setting of afib and complete heart block s/p SJM PPM in 10/2012.  Echo at that time showed normal LV function. He has done reasonably well over the past year and a half. He was last seen in clinic in May at which time his pacemaker showed normal device function. He has permanent atrial fibrillation. He is not chronically anticoagulated. Approximately 1-2 weeks ago, he began to experience progressive dyspnea exertion, now occurring with minimal activity. He's also been experiencing orthopnea, PND, some increase in abdominal girth, constipation, and early satiety. He has not been having chest pain or lower extremity edema. His weight has been stable at about 202 pounds. He called the office today to arrange for an appointment and was noted to be short of breath while on the line. He was transferred to the triage team and subsequently advised to present to the ED. Here, his chest x-ray shows mild pulmonary venous congestion with right lower lobe atelectasis and/or infiltrate and associated right pleural effusion. His proBNP is elevated at 2923. Initial troponin is negative. He has been given IV Lasix with good output of approximately 500 mL so far. At rest, he has no  dyspnea.  Home Medications  Prior to Admission medications   Medication Sig Start Date End Date Taking? Authorizing Provider  aspirin 325 MG buffered tablet Take 1 tablet (325 mg total) by mouth daily. 11/15/12  Yes Evans Lance, MD  lisinopril (PRINIVIL,ZESTRIL) 10 MG tablet Take 15 mg by mouth daily.   Yes Historical Provider, MD  OVER THE COUNTER MEDICATION Take 1 packet by mouth daily. Vitamin packet   Yes Historical Provider, MD    Family History  Family History  Problem Relation Age of Onset  . Stroke Mother     34s  . Stroke Father 80   Social History  History   Social History  . Marital Status: Married    Spouse Name: N/A    Number of Children: N/A  . Years of Education: N/A   Occupational History  . Not on file.   Social History Main Topics  . Smoking status: Light Tobacco Smoker    Types: Cigars  . Smokeless tobacco: Never Used  . Alcohol Use: Yes     Comment: very rarely  . Drug Use: No  . Sexual Activity:    Other Topics Concern  . Not on file   Social History Narrative  . Lives in [place] with his wife.     Review of Systems General:  No chills, fever, night sweats or  weight changes.  Cardiovascular:  No chest pain, +++ dyspnea on exertion, orthopnea, pnd.  No edema, palpitations. Dermatological: No rash, lesions/masses Respiratory: No cough, +++ dyspnea Urologic: No hematuria, dysuria Abdominal:   No nausea, vomiting, diarrhea, bright red blood per rectum, melena, or hematemesis Neurologic:  No visual changes, wkns, changes in mental status. All other systems reviewed and are otherwise negative except as noted above.  Physical Exam  Blood pressure 142/90, pulse 70, temperature 97.6 F (36.4 C), temperature source Oral, resp. rate 18, height 5\' 9"  (1.753 m), weight 202 lb (91.627 kg), SpO2 98.00%.  General: Pleasant, NAD Psych: Normal affect. Neuro: Alert and oriented X 3. Moves all extremities spontaneously. HEENT: Normal  Neck: Supple  without bruits.  Mod elevated JVP. Lungs:  Resp regular and unlabored, markedly diminished breath sounds in the right base. Heart: RRR no s3, s4, or murmurs. Abdomen: Soft, non-tender, non-distended, BS + x 4.  Extremities: No clubbing, cyanosis or edema. DP/PT/Radials 2+ and equal bilaterally.  Labs  Troponin [facility] of Care Test)  Recent Labs  05/08/14 1453  TROPIPOC 0.04   Lab Results  Component Value Date   WBC 8.3 05/08/2014   HGB 17.1* 05/08/2014   HCT 49.0 05/08/2014   MCV 93.2 05/08/2014   PLT 129* 05/08/2014     Recent Labs Lab 05/08/14 1430  NA 136*  K 4.4  CL 98  CO2 20  BUN 20  CREATININE 0.84  CALCIUM 9.2  GLUCOSE 102*   Radiology/Studies  Dg Chest 2 View  05/08/2014   CLINICAL DATA:  Hypertension.  EXAM: CHEST  2 VIEW  COMPARISON:  11/06/2012.  FINDINGS: Mediastinum and hilar structures normal. Cardiac pacer with tips in right atrium and right ventricle. Cardiomegaly with mild pulmonary vascular prominence noted. Mild infiltrate and/or atelectasis right lung base. Small right pleural effusion. No pneumothorax. No acute osseous abnormality.  IMPRESSION: 1. Severe cardiomegaly. Cardiac pacer noted with lead tips in right atrium and right ventricle. Mild pulmonary venous congestion . 2. Right lower lobe atelectasis and/or infiltrate. Associated right pleural effusion.   Electronically Signed   By: Marcello Moores  Register   On: 05/08/2014 16:45   ECG  V paced, underlying afib.  ASSESSMENT AND PLAN  1. Acute CHF:  ? Diastolic vs new systolic dysfxn.  Pt presents with a 1 - 2 wk h/o progressive DOE, orthopnea, and PND. He presented to the ED today with evidence of volume overload, abnormal chest x-ray, and elevated BNP. He has been treated with IV Lasix and has responded well so far. He is stable at rest. We'll plan to observe him and continue to cycle cardiac markers. LV function was normal in 2014 and we will repeat echo in the morning. Will also plan to interrogate  pacemaker although this appears to be functioning normally looking at telemetry and ECG. Continue IV diuresis. If EF is down, he will require an ischemic evaluation.  2. Permanent atrial fibrillation: This is well rate controlled. He is not on any AV nodal blocking agents. He is also not using oral anticoagulation.  CHA2DS2VASc = 1 (age > 56) though with new onset of heart failure, we may need to readdress long-term anticoagulation plans.  Signed, Murray Hodgkins, NP 05/08/2014, 5:39 PM Seen in ER. Agree with assessment and plans as noted above. Chest xray personally reviewed. There has been a significant increase in heart size since 2014. CT of chest shows extensive calcifications of his coronary arteries although no history of chest pain. His exam is notable  for decreased breath sounds at right base. No peripheral edema. EKG not helpful for ischemia because of V-pacing. Past history of interest in that he had partial resection of his colon for colon cancer in the 74s at [facility] and then had subsequent resection of isolated liver metastasis also at [facility].  Plan will be to diurese overnight, cycle enzymes, get 2 D echo in am. Suspect he may require cardiac cath if EF is down and with CT showing extensive calcification.

## 2014-05-08 NOTE — ED Notes (Signed)
Daniqua, receiving RN, will call back.

## 2014-05-08 NOTE — Telephone Encounter (Signed)
Received direct call from scheduling that pt was on phone complaining of shortness of breath. I spoke with pt who reports shortness of breath for last week. This is new for him. No swelling, no weight gain. Weight stable at 202 lbs. He reports this is with exertion. Some days he is able to do normal activities like going to store without problems. Other times he is short of breath with this.  Has been trying to exercise but could not use exercise machine this AM due to shortness of breath. Has not been sleeping well and shortness of breath seems to occur with positional change when trying to sleep. He is resting at present time and not having shortness of breath. Has not checked heart rate recently but when last checked it was 70-72. He uses wrist machine to check and is not sure of accuracy.  Pt would like to know which hospital he should go to for evaluation. I told pt if shortness of breath was this severe he should contact EMS for transport to [facility].

## 2014-05-08 NOTE — ED Notes (Addendum)
Patient transported to CT 

## 2014-05-08 NOTE — ED Notes (Signed)
Cardiology at bedside.

## 2014-05-08 NOTE — ED Notes (Signed)
Pt reports sob worsening over past several weeks. Denies cp. SOB at rest and exertion. Called PCP today and told to come to ED. Denies CP. Pt is a x 4. Respirations smooth and unlabored at rest. Able to speak in clear and concise sentences. VSS

## 2014-05-08 NOTE — Progress Notes (Signed)
Report received from ED nurse and patient has arrived to the unit, the patient's Vital signs are stable and telemetry has been verified with Monitor Tech. The patient will not take any medications until he speaks with his Cardiologist in [place]. Will continue to monitor patient. Ruben Im RN

## 2014-05-08 NOTE — ED Notes (Signed)
Lab called to add on blood work 

## 2014-05-08 NOTE — Telephone Encounter (Signed)
New problem ° ° ° °Pt having SOB.. °

## 2014-05-08 NOTE — ED Notes (Signed)
Pt transported to radiology.

## 2014-05-08 NOTE — ED Notes (Signed)
Attempted to call report

## 2014-05-08 NOTE — ED Notes (Signed)
Pt transported back to room from Xray by this RN

## 2014-05-08 NOTE — ED Provider Notes (Signed)
CSN: [id number]     Arrival date & time 05/08/14  1419 History   First MD Initiated Contact with Patient 05/08/14 1504     Chief Complaint  Patient presents with  . Shortness of Breath      HPI  Patient presents with shortness of breath. He describes about 6 weeks of shortness of breath. Head is with exertion per his report to me. He states that with his ADLs around the home he is short of breath he takes "30 or 40 steps". He has no pain or angina with this. He normally does 3 minutes at a time on an elliptical. States he did this today and was so short of breath he "can hardly walk back up the stairs". No pain. No edema. No cough. No fever. No symptoms at rest. Does describe Orthopnea, No PND  History of pacemaker for symptomatic bradycardia. Then with Dr. Lovena Le 18 months ago.  Echo 1/14 with EF 60-65%.  No history of known coronary artery disease. Never had angiogram.   Past Medical History  Diagnosis Date  . Hypertension   . Syncope     a. 10/2012 Echo: EF 60-65%, no reg wma, mild MR, mildly dil LA.  . CHB (complete heart block)     a. 10/2012 s/p SJM 22[address] DR RF DC PPM, ser # [id number].  Marland Kitchen A-fib   . 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 Colon surgery    . Liver resection      Liver CA   Family History  Problem Relation Age of Onset  . Stroke Mother     33s  . Stroke Father 25   History  Substance Use Topics  . Smoking status: Light Tobacco Smoker    Types: Cigars  . Smokeless tobacco: Never Used  . Alcohol Use: Yes     Comment: very rarely    Review of Systems  Constitutional: Negative for fever, chills, diaphoresis, appetite change and fatigue.  HENT: Negative for mouth sores, sore throat and trouble swallowing.   Eyes: Negative for visual disturbance.  Respiratory: Positive for shortness of breath. Negative for cough, chest tightness and wheezing.   Cardiovascular: Negative for chest  pain.  Gastrointestinal: Negative for nausea, vomiting, abdominal pain, diarrhea and abdominal distention.  Endocrine: Negative for polydipsia, polyphagia and polyuria.  Genitourinary: Negative for dysuria, frequency and hematuria.  Musculoskeletal: Negative for gait problem.  Skin: Negative for color change, pallor and rash.  Neurological: Negative for dizziness, syncope, light-headedness and headaches.  Hematological: Does not bruise/bleed easily.  Psychiatric/Behavioral: Negative for behavioral problems and confusion.      Allergies  Review of patient's allergies indicates no known allergies.  Home Medications   Prior to Admission medications   Medication Sig Start Date End Date Taking? Authorizing Provider  aspirin 325 MG buffered tablet Take 1 tablet (325 mg total) by mouth daily. 11/15/12  Yes Evans Lance, MD  lisinopril (PRINIVIL,ZESTRIL) 10 MG tablet Take 15 mg by mouth daily.   Yes Historical Provider, MD  OVER THE COUNTER MEDICATION Take 1 packet by mouth daily. Vitamin packet   Yes Historical Provider, MD   BP 128/72  Pulse 72  Temp(Src) 97.8 F (36.6 C) (Oral)  Resp 20  Ht 5\' 9"  (1.753 m)  Wt 200 lb 4.8 oz (90.855 kg)  BMI 29.57 kg/m2  SpO2 100% Physical Exam  Constitutional: He is oriented to person, place,  and time. He appears well-developed and well-nourished. No distress.  HENT:  Head: Normocephalic.  Eyes: Conjunctivae are normal. Pupils are equal, round, and reactive to light. No scleral icterus.  Neck: Normal range of motion. Neck supple. No thyromegaly present.  Cardiovascular: Normal rate and regular rhythm.  Exam reveals no gallop and no friction rub.   No murmur heard. Regular. Paced rhythm on the monitor. No JVD. No gallop. No peripheral edema.  Pulmonary/Chest: Effort normal and breath sounds normal. No respiratory distress. He has no wheezes. He has no rales.  Clear lungs anteriorly without wheezing or prolongation. Diminished right basilar breath  sounds.  Abdominal: Soft. Bowel sounds are normal. He exhibits no distension. There is no tenderness. There is no rebound.  Musculoskeletal: Normal range of motion.  Neurological: He is alert and oriented to person, place, and time.  Skin: Skin is warm and dry. No rash noted.  Psychiatric: He has a normal mood and affect. His behavior is normal.    ED Course  Procedures (including critical care time) Labs Review Labs Reviewed  CBC - Abnormal; Notable for the following:    Hemoglobin 17.1 (*)    Platelets 129 (*)    All other components within normal limits  BASIC METABOLIC PANEL - Abnormal; Notable for the following:    Sodium 136 (*)    Glucose, Bld 102 (*)    GFR calc non Af Amer 87 (*)    Anion gap 18 (*)    All other components within normal limits  PRO B NATRIURETIC PEPTIDE - Abnormal; Notable for the following:    Pro B Natriuretic peptide (BNP) 2923.0 (*)    All other components within normal limits  COMPREHENSIVE METABOLIC PANEL  Randolm Idol, ED    Imaging Review Dg Chest 2 View  05/08/2014   CLINICAL DATA:  Hypertension.  EXAM: CHEST  2 VIEW  COMPARISON:  11/06/2012.  FINDINGS: Mediastinum and hilar structures normal. Cardiac pacer with tips in right atrium and right ventricle. Cardiomegaly with mild pulmonary vascular prominence noted. Mild infiltrate and/or atelectasis right lung base. Small right pleural effusion. No pneumothorax. No acute osseous abnormality.  IMPRESSION: 1. Severe cardiomegaly. Cardiac pacer noted with lead tips in right atrium and right ventricle. Mild pulmonary venous congestion . 2. Right lower lobe atelectasis and/or infiltrate. Associated right pleural effusion.   Electronically Signed   By: [place]   On: 05/08/2014 16:45   Ct Chest W Contrast  05/08/2014   CLINICAL DATA:  Progressive shortness of breath. Right pleural effusion.  EXAM: CT CHEST WITH CONTRAST  TECHNIQUE: Multidetector CT imaging of the chest was performed during  intravenous contrast administration.  CONTRAST:  80mL OMNIPAQUE IOHEXOL 300 MG/ML  SOLN  COMPARISON:  Chest x-ray dated 05/08/2014  FINDINGS: There is new cardiomegaly with a large right pleural effusion and a small left effusion. There is compressive atelectasis in the right lower lobe. There is a small pericardial effusion. The patient has extensive calcifications in the coronary arteries. No hilar or mediastinal adenopathy.  No acute osseous abnormality. Anterior osteophytes fuse most of the thoracic spine.  IMPRESSION: New cardiomegaly with bilateral pleural effusions, large on the right and small on the left. Small pericardial effusion.   Electronically Signed   By: Rozetta Nunnery M.D.   On: 05/08/2014 18:37     EKG Interpretation   Date/Time:  Thursday May 08 2014 14:22:56 EDT Ventricular Rate:  76 PR Interval:    QRS Duration: 168 QT Interval:  462  QTC Calculation: 519 R Axis:   -70 Text Interpretation:  Ventricular-paced rhythm with occasional Premature  ventricular complexes Abnormal ECG Prior EKG without paced rhythm  Confirmed by HORTON  MD, Melvin ([id number]) on 05/08/2014 2:29:19 PM      MDM   Final diagnoses:  Congestive heart failure, unspecified congestive heart failure chronicity, unspecified congestive heart failure type    Patient with new right pleural effusion. Pulmonary venous congestion on CXR. Elevated BNP. Cardiomegaly versus comparison chest x-ray one of 2014. Exertional dyspnea. Symptoms are consistent with congestive heart failure. Effusion and probable atelectasis right lung. However cannot rule out infiltrate. Pt without other surrogate signs of pneumonia:  No fever, cough, Leukocytosis, increased sputum. Contrast CT requested. Cardiology consultation made. Given IV Lasix.  17:30:  D/W Dr. Mare Ferrari. Dr. Mare Ferrari, as always, immediately returned my call.  He will see pt in ER.    Tanna Furry, MD 05/09/14 [phone]

## 2014-05-09 ENCOUNTER — Other Ambulatory Visit: Payer: Self-pay | Admitting: Physician Assistant

## 2014-05-09 ENCOUNTER — Encounter (HOSPITAL_COMMUNITY): Payer: Self-pay | Admitting: General Practice

## 2014-05-09 DIAGNOSIS — I429 Cardiomyopathy, unspecified: Secondary | ICD-10-CM

## 2014-05-09 DIAGNOSIS — I059 Rheumatic mitral valve disease, unspecified: Secondary | ICD-10-CM

## 2014-05-09 DIAGNOSIS — R55 Syncope and collapse: Secondary | ICD-10-CM

## 2014-05-09 DIAGNOSIS — Z950 Presence of cardiac pacemaker: Secondary | ICD-10-CM

## 2014-05-09 LAB — COMPREHENSIVE METABOLIC PANEL
ALBUMIN: 3.6 g/dL (ref 3.5–5.2)
ALT: 22 U/L (ref 0–53)
ANION GAP: 17 — AB (ref 5–15)
AST: 34 U/L (ref 0–37)
Alkaline Phosphatase: 60 U/L (ref 39–117)
BUN: 19 mg/dL (ref 6–23)
CALCIUM: 8.9 mg/dL (ref 8.4–10.5)
CHLORIDE: 96 meq/L (ref 96–112)
CO2: 24 meq/L (ref 19–32)
CREATININE: 1 mg/dL (ref 0.50–1.35)
GFR calc Af Amer: 87 mL/min — ABNORMAL LOW (ref >90)
GFR, EST NON AFRICAN AMERICAN: 75 mL/min — AB (ref >90)
Glucose, Bld: 84 mg/dL (ref 70–99)
Potassium: 4.4 mEq/L (ref 3.7–5.3)
Sodium: 137 mEq/L (ref 137–147)
Total Bilirubin: 1.5 mg/dL — ABNORMAL HIGH (ref 0.3–1.2)
Total Protein: 7 g/dL (ref 6.0–8.3)

## 2014-05-09 MED ORDER — CARVEDILOL 3.125 MG PO TABS: 3.1250 mg | ORAL_TABLET | Freq: Two times a day (BID) | ORAL | Status: DC

## 2014-05-09 MED ORDER — FUROSEMIDE 40 MG PO TABS
40.0000 mg | ORAL_TABLET | Freq: Two times a day (BID) | ORAL | Status: DC
  Filled 2014-05-09 (×2): qty 1

## 2014-05-09 MED ORDER — POTASSIUM CHLORIDE ER 10 MEQ PO TBCR: 10.0000 meq | EXTENDED_RELEASE_TABLET | Freq: Every day | ORAL | Status: DC

## 2014-05-09 MED ORDER — FUROSEMIDE 40 MG PO TABS: 40.0000 mg | ORAL_TABLET | Freq: Every day | ORAL | Status: DC

## 2014-05-09 NOTE — Progress Notes (Signed)
Pt insisting he wants to go home and would like to speak with the doctor.  Tenny Craw, PA informed and instructed that he will be up to see pt.  Pt up ambulating in the hallway, made aware.  Karie Kirks, Therapist, sports.

## 2014-05-09 NOTE — Progress Notes (Signed)
*  PRELIMINARY RESULTS* Echocardiogram 2D Echocardiogram has been performed.  Derek Odonnell 05/09/2014, 1:41 PM

## 2014-05-09 NOTE — Progress Notes (Addendum)
SUBJECTIVE:  No CP.  Bearthing better  OBJECTIVE:   Vitals:   Filed Vitals:   05/08/14 2037 05/08/14 2225 05/09/14 0624 05/09/14 1014  BP: 126/84 128/72 124/68 132/74  Pulse: 68 72 78 70  Temp: 97.9 F (36.6 C) 97.8 F (36.6 C) 97.4 F (36.3 C)   TempSrc: Oral Oral Oral   Resp: 20 20 18    Height: 5\' 9"  (1.753 m)     Weight: 200 lb 4.8 oz (90.855 kg)  200 lb 2.1 oz (90.78 kg)   SpO2: 98% 100% 100%    I&O's:   Intake/Output Summary (Last 24 hours) at 05/09/14 1042 Last data filed at 05/09/14 5400  Gross per 24 hour  Intake    480 ml  Output   1775 ml  Net  -1295 ml   TELEMETRY: Reviewed telemetry pt in paced:     PHYSICAL EXAM General: Well developed, well nourished, in no acute distress Head:   Normal cephalic and atramatic  Lungs:   Clear bilaterally to auscultation. Heart:   HRRR S1 S2  No JVD.   Abdomen: abdomen soft and non-tender Msk:  Back normal,  Normal strength and tone for age. Extremities:   No edema.   Neuro: Alert and oriented. Psych:  Normal affect, responds appropriately   LABS: Basic Metabolic Panel:  Recent Labs  05/08/14 1430 05/09/14 0405  NA 136* 137  K 4.4 4.4  CL 98 96  CO2 20 24  GLUCOSE 102* 84  BUN 20 19  CREATININE 0.84 1.00  CALCIUM 9.2 8.9   Liver Function Tests:  Recent Labs  05/09/14 0405  AST 34  ALT 22  ALKPHOS 60  BILITOT 1.5*  PROT 7.0  ALBUMIN 3.6   No results found for this basename: LIPASE, AMYLASE,  in the last 72 hours CBC:  Recent Labs  05/08/14 1430  WBC 8.3  HGB 17.1*  HCT 49.0  MCV 93.2  PLT 129*   Cardiac Enzymes: No results found for this basename: CKTOTAL, CKMB, CKMBINDEX, TROPONINI,  in the last 72 hours BNP: No components found with this basename: POCBNP,  D-Dimer: No results found for this basename: DDIMER,  in the last 72 hours Hemoglobin A1C: No results found for this basename: HGBA1C,  in the last 72 hours Fasting Lipid Panel: No results found for this basename: CHOL,  HDL, LDLCALC, TRIG, CHOLHDL, LDLDIRECT,  in the last 72 hours Thyroid Function Tests: No results found for this basename: TSH, T4TOTAL, FREET3, T3FREE, THYROIDAB,  in the last 72 hours Anemia Panel: No results found for this basename: VITAMINB12, FOLATE, FERRITIN, TIBC, IRON, RETICCTPCT,  in the last 72 hours Coag Panel:   Lab Results  Component Value Date   INR 1.06 11/05/2012    RADIOLOGY: Dg Chest 2 View  05/08/2014   CLINICAL DATA:  Hypertension.  EXAM: CHEST  2 VIEW  COMPARISON:  11/06/2012.  FINDINGS: Mediastinum and hilar structures normal. Cardiac pacer with tips in right atrium and right ventricle. Cardiomegaly with mild pulmonary vascular prominence noted. Mild infiltrate and/or atelectasis right lung base. Small right pleural effusion. No pneumothorax. No acute osseous abnormality.  IMPRESSION: 1. Severe cardiomegaly. Cardiac pacer noted with lead tips in right atrium and right ventricle. Mild pulmonary venous congestion . 2. Right lower lobe atelectasis and/or infiltrate. Associated right pleural effusion.   Electronically Signed   By: [place]   On: 05/08/2014 16:45   Ct Chest W Contrast  05/08/2014   CLINICAL DATA:  Progressive shortness of  breath. Right pleural effusion.  EXAM: CT CHEST WITH CONTRAST  TECHNIQUE: Multidetector CT imaging of the chest was performed during intravenous contrast administration.  CONTRAST:  38mL OMNIPAQUE IOHEXOL 300 MG/ML  SOLN  COMPARISON:  Chest x-ray dated 05/08/2014  FINDINGS: There is new cardiomegaly with a large right pleural effusion and a small left effusion. There is compressive atelectasis in the right lower lobe. There is a small pericardial effusion. The patient has extensive calcifications in the coronary arteries. No hilar or mediastinal adenopathy.  No acute osseous abnormality. Anterior osteophytes fuse most of the thoracic spine.  IMPRESSION: New cardiomegaly with bilateral pleural effusions, large on the right and small on the left.  Small pericardial effusion.   Electronically Signed   By: Rozetta Nunnery M.D.   On: 05/08/2014 18:37      ASSESSMENT: Kathyrn Lass:  Volume overload, DOE  1) Will try to differentiate between systolic vs. Diastolic heart failure.  He has responded well to diuresis.  If his EF is low, he will need LHC.  I do not think he will stay the weekend to have a cath Monday if needed.  If he goes home, will need to go home on diuretic therapy.  Switch to Furosemide 40 mg BID PO.  2) He has known left shoulder impingement.  Normal treated at [facility]  He requests to see an orthopedist for cortisone injection in the left shoulder, so that he does not have to go to [facility].  3) AFib. COntinue aspirin.  Jettie Booze., MD  05/09/2014  10:42 AM

## 2014-05-09 NOTE — Progress Notes (Signed)
Patient refused heparin. RN explained purpose of heparin, patient still refused.

## 2014-05-09 NOTE — Progress Notes (Signed)
All d/c instructions explained and given to pt.  Verbalized understanding.    Pt d/c off floor via ambulatory.  Refused w/c.Marland Kitchen  Karie Kirks, Therapist, sports.

## 2014-05-09 NOTE — Progress Notes (Signed)
UR completed Ikia Cincotta K. Jezel Basto, RN, BSN, [place], CCM  05/09/2014 10:01 AM

## 2014-05-09 NOTE — Care Management Note (Addendum)
  Page 1 of 1   05/09/2014     10:09:09 AM CARE MANAGEMENT NOTE 05/09/2014  Patient:  Derek Odonnell, Derek Odonnell   Account Number:  [id number]  Date Initiated:  05/09/2014  Documentation initiated by:  Howie Rufus  Subjective/Objective Assessment:   CHF     Action/Plan:   CM to follow for dispositon needs   Anticipated DC Date:  05/12/2014   Anticipated DC Plan:  HOME/SELF CARE         Choice offered to / List presented to:             Status of service:   Medicare Important Message given?   (If response is "NO", the following Medicare IM given date fields will be blank) Date Medicare IM given:   Medicare IM given by:   Date Additional Medicare IM given:   Additional Medicare IM given by:    Discharge Disposition:    Per UR Regulation:  Reviewed for med. necessity/level of care/duration of stay  If discussed at Long Length of Stay Meetings, dates discussed:    Comments:  Cherrise Occhipinti RN, BSN, MSHL, CCM  Nurse - Case Manager,  ([facility] [place])  [phone]  05/09/2014 ADM:  CHF Social:  From home with wife Dispositon Plan:  Home / Self Care CM will continue to monitor for disposition needs

## 2014-05-09 NOTE — Discharge Summary (Addendum)
Physician Discharge Summary     Patient ID: Derek Odonnell MRN: [id number] DOB/AGE: 1946-02-11 68 y.o.  Admit date: 05/08/2014 Discharge date: 05/09/2014  Admission Diagnoses:  Acute CHF  Discharge Diagnoses:  Principal Problem:   Acute systolic CHF Active Problems:   Hypertension   Pacemaker   A-fib   Cardiomyopathy, New,  EF 35-40%  Discharged Condition: stable  Hospital Course:   68 y/o male with a h/o syncope in the setting of afib and complete heart block s/p SJM PPM in 10/2012. Echo at that time showed normal LV function. He has done reasonably well over the past year and a half. He was last seen in clinic in May at which time his pacemaker showed normal device function. He has permanent atrial fibrillation. He is not chronically anticoagulated. Approximately 1-2 weeks ago, he began to experience progressive dyspnea exertion, now occurring with minimal activity. He's also been experiencing orthopnea, PND, some increase in abdominal girth, constipation, and early satiety. He has not been having chest pain or lower extremity edema. His weight has been stable at about 202 pounds. He called the office today to arrange for an appointment and was noted to be short of breath while on the line. He was transferred to the triage team and subsequently advised to present to the ED. Here, his chest x-ray shows mild pulmonary venous congestion with right lower lobe atelectasis and/or infiltrate and associated right pleural effusion. His proBNP is elevated at 2923. Initial troponin is negative. He has been given IV Lasix with good output of approximately 500 mL so far. At rest, he has no dyspnea.  The patient was admitted for IV diuresis which resulted in 2.4L for net fluid loss.  He was continued on Lisinopril 15mg  daily.  2D echo revealed a decreased EF of 35-40% from prior normal EF in Jan 2014.  He was also started on Coreg 3.125mg  BID and continued on PO lasix of 40mg  daily.  A low dose of daily  potassium was also added.  He will be scheduled for a left heart cath next week.  Weight monitoring and low sodium diet was discussed in detail.  He is on full dose ASA for afib anticoagulation.  The patient was seen by Dr. Irish Odonnell who felt he was stable for DC home.     Consults: None  Significant Diagnostic Studies:  Echocardiogram Study Conclusions  - Left ventricle: The cavity size was mildly dilated. Wall thickness was increased in a pattern of mild LVH. Systolic function was moderately reduced. The estimated ejection fraction was in the range of 35% to 40%. Diffuse hypokinesis. - Mitral valve: There was moderate regurgitation. - Left atrium: The atrium was moderately dilated. - Right atrium: The atrium was mildly dilated. - Pericardium, extracardiac: A trivial pericardial effusion was identified.   Treatments:See above  Discharge Exam: Blood pressure 126/78, pulse 68, temperature 97.5 F (36.4 C), temperature source Oral, resp. rate 18, height 5\' 9"  (1.753 m), weight 200 lb 2.1 oz (90.78 kg), SpO2 100.00%.   Disposition: 01-Home or Self Care      Discharge Instructions   Diet - low sodium heart healthy    Complete by:  As directed      Discharge instructions    Complete by:  As directed   Monitor weight daily.  If you gain 3 pounds in 24 hour, or 5 pounds in a week,  Call the office at [phone].  If your weight decreases, stop the lasix and call the office to  let us know.     Increase activity slowly    Complete by:  As directed             Medication List         aspirin 325 MG buffered tablet  Take 1 tablet (325 mg total) by mouth daily.     carvedilol 3.125 MG tablet  Commonly known as:  COREG  Take 1 tablet (3.125 mg total) by mouth 2 (two) times daily with a meal.     furosemide 40 MG tablet  Commonly known as:  LASIX  Take 1 tablet (40 mg total) by mouth daily.     lisinopril 10 MG tablet  Commonly known as:  PRINIVIL,ZESTRIL  Take 15 mg by  mouth daily.     OVER THE COUNTER MEDICATION  Take 1 packet by mouth daily. Vitamin packet     potassium chloride 10 MEQ tablet  Commonly known as:  K-DUR  Take 1 tablet (10 mEq total) by mouth daily.       Follow-up Information   Follow up with Derek Odonnell., MD. (The office scheduler will call you on Monday and let you know when your heart cath will be.  It will be either Tuesday or Thursday next week.)    Specialty:  Interventional Cardiology   Contact information:   [address] [place] [address] [phone]      Greater than 30 minutes was spent completing the patient's discharge.    SignedTarri Odonnell , PA-C  05/09/2014, 4:17 PM  I have examined the patient and reviewed assessment and plan and discussed with patient.  Agree with above as stated.  Plan for outpatient cath next week to work up cardiomyopathy.  He will not stay until Monday.  His euvolemic.  Start Lasix and beta blocker.  He was already on an ACE-I.  Cardiac cath explained to patient and all questions answered.  Will have to address anticoagulation after the cath given AFib history.  Derek Odonnell S.

## 2014-05-13 ENCOUNTER — Encounter: Payer: Self-pay | Admitting: Cardiology

## 2014-05-13 ENCOUNTER — Telehealth: Payer: Self-pay | Admitting: Internal Medicine

## 2014-05-13 ENCOUNTER — Other Ambulatory Visit (INDEPENDENT_AMBULATORY_CARE_PROVIDER_SITE_OTHER): Payer: Medicare Other

## 2014-05-13 DIAGNOSIS — I429 Cardiomyopathy, unspecified: Secondary | ICD-10-CM

## 2014-05-13 DIAGNOSIS — I428 Other cardiomyopathies: Secondary | ICD-10-CM

## 2014-05-13 NOTE — Telephone Encounter (Signed)
New message    Patient calling checking on the status of heart cath   Attaching message   Follow-up Information  Follow up with Jettie Booze., MD. (The office scheduler will call you on Monday and let you know when your heart cath will be. It will be either Tuesday or Thursday next week.)

## 2014-05-13 NOTE — Telephone Encounter (Signed)
Spoke with pt and was upset that no one called him yesterday to set up heart Cath. I set up heart cath for 05/15/14 at 7:30 am with 5:30 am arrival. Pt wanted me to move cath to a later time, therefore, I moved cath to 10:30 am with arrival at 8:30 am. Pt will come for PT/INR today as CBC and Cmet were drawn on 05/08/14. Pt still didn't seem to be satisfied, but he did thank me for calling and setting everything up.

## 2014-05-14 LAB — PROTIME-INR
INR: 1.2 ratio — AB (ref 0.8–1.0)
PROTHROMBIN TIME: 12.8 s (ref 9.6–13.1)

## 2014-05-15 ENCOUNTER — Encounter (HOSPITAL_COMMUNITY)
Admission: RE | Disposition: A | Discharge status: Home / Self Care | Payer: Self-pay | Source: Ambulatory Visit | Attending: Interventional Cardiology

## 2014-05-15 ENCOUNTER — Ambulatory Visit (HOSPITAL_COMMUNITY)
Admission: RE | Admit: 2014-05-15 | Discharge: 2014-05-15 | Disposition: A | Discharge status: Home / Self Care | Payer: Medicare Other | Source: Ambulatory Visit | Attending: Interventional Cardiology | Admitting: Interventional Cardiology

## 2014-05-15 ENCOUNTER — Other Ambulatory Visit: Payer: Self-pay | Admitting: Interventional Cardiology

## 2014-05-15 DIAGNOSIS — I428 Other cardiomyopathies: Secondary | ICD-10-CM | POA: Diagnosis present

## 2014-05-15 DIAGNOSIS — M25819 Other specified joint disorders, unspecified shoulder: Secondary | ICD-10-CM | POA: Diagnosis not present

## 2014-05-15 DIAGNOSIS — I2510 Atherosclerotic heart disease of native coronary artery without angina pectoris: Secondary | ICD-10-CM | POA: Insufficient documentation

## 2014-05-15 DIAGNOSIS — M7580 Other shoulder lesions, unspecified shoulder: Secondary | ICD-10-CM

## 2014-05-15 DIAGNOSIS — I4891 Unspecified atrial fibrillation: Secondary | ICD-10-CM | POA: Insufficient documentation

## 2014-05-15 DIAGNOSIS — Z7982 Long term (current) use of aspirin: Secondary | ICD-10-CM | POA: Insufficient documentation

## 2014-05-15 DIAGNOSIS — I5021 Acute systolic (congestive) heart failure: Secondary | ICD-10-CM

## 2014-05-15 DIAGNOSIS — I509 Heart failure, unspecified: Secondary | ICD-10-CM | POA: Insufficient documentation

## 2014-05-15 DIAGNOSIS — I2582 Chronic total occlusion of coronary artery: Secondary | ICD-10-CM | POA: Insufficient documentation

## 2014-05-15 DIAGNOSIS — I2584 Coronary atherosclerosis due to calcified coronary lesion: Secondary | ICD-10-CM | POA: Insufficient documentation

## 2014-05-15 HISTORY — PX: LEFT HEART CATHETERIZATION WITH CORONARY ANGIOGRAM: SHX5451

## 2014-05-15 SURGERY — LEFT HEART CATHETERIZATION WITH CORONARY ANGIOGRAM
Anesthesia: LOCAL

## 2014-05-15 MED ORDER — MORPHINE SULFATE 2 MG/ML IJ SOLN: 1.0000 mg | INTRAMUSCULAR | Status: DC | PRN

## 2014-05-15 MED ORDER — ASPIRIN 81 MG PO CHEW: 81.0000 mg | CHEWABLE_TABLET | ORAL | Status: DC

## 2014-05-15 MED ORDER — SODIUM CHLORIDE 0.9 % IV SOLN
INTRAVENOUS | Status: DC
  Administered 2014-05-15: 10:00:00 via INTRAVENOUS

## 2014-05-15 MED ORDER — NITROGLYCERIN 1 MG/10 ML FOR IR/CATH LAB
INTRA_ARTERIAL | Status: AC
  Filled 2014-05-15: qty 10

## 2014-05-15 MED ORDER — HEPARIN SODIUM (PORCINE) 1000 UNIT/ML IJ SOLN
INTRAMUSCULAR | Status: AC
  Filled 2014-05-15: qty 1

## 2014-05-15 MED ORDER — SODIUM CHLORIDE 0.9 % IV SOLN: INTRAVENOUS | Status: AC

## 2014-05-15 MED ORDER — SODIUM CHLORIDE 0.9 % IV SOLN: 250.0000 mL | INTRAVENOUS | Status: DC | PRN

## 2014-05-15 MED ORDER — VERAPAMIL HCL 2.5 MG/ML IV SOLN
INTRAVENOUS | Status: AC
  Filled 2014-05-15: qty 2

## 2014-05-15 MED ORDER — MIDAZOLAM HCL 2 MG/2ML IJ SOLN
INTRAMUSCULAR | Status: AC
  Filled 2014-05-15: qty 2

## 2014-05-15 MED ORDER — HEPARIN (PORCINE) IN NACL 2-0.9 UNIT/ML-% IJ SOLN
INTRAMUSCULAR | Status: AC
Start: 2014-05-15 — End: 2014-05-15
  Filled 2014-05-15: qty 1000

## 2014-05-15 MED ORDER — FENTANYL CITRATE 0.05 MG/ML IJ SOLN
INTRAMUSCULAR | Status: AC
  Filled 2014-05-15: qty 2

## 2014-05-15 MED ORDER — SODIUM CHLORIDE 0.9 % IJ SOLN: 3.0000 mL | INTRAMUSCULAR | Status: DC | PRN

## 2014-05-15 MED ORDER — SODIUM CHLORIDE 0.9 % IJ SOLN: 3.0000 mL | Freq: Two times a day (BID) | INTRAMUSCULAR | Status: DC

## 2014-05-15 MED ORDER — LIDOCAINE HCL (PF) 1 % IJ SOLN
INTRAMUSCULAR | Status: AC
  Filled 2014-05-15: qty 30

## 2014-05-15 NOTE — Progress Notes (Signed)
Pt refused groin prep.

## 2014-05-15 NOTE — Interval H&P Note (Signed)
Cath Lab Visit (complete for each Cath Lab visit)  Clinical Evaluation Leading to the Procedure:   ACS: Yes.    Non-ACS:    Anginal Classification: CCS IV  Anti-ischemic medical therapy: Minimal Therapy (1 class of medications)  Non-Invasive Test Results: Low EF on echo  Prior CABG: No previous CABG      History and Physical Interval Note:  05/15/2014 12:30 PM  [place]  has presented today for surgery, with the diagnosis of cm/afib/chf  The various methods of treatment have been discussed with the patient and family. After consideration of risks, benefits and other options for treatment, the patient has consented to  Procedure(s): LEFT HEART CATHETERIZATION WITH CORONARY ANGIOGRAM (N/A) as a surgical intervention .  The patient's history has been reviewed, patient examined, no change in status, stable for surgery.  I have reviewed the patient's chart and labs.  Questions were answered to the patient's satisfaction.     Haunani Dickard S.

## 2014-05-15 NOTE — H&P (View-Only) (Signed)
SUBJECTIVE:  No CP.  Bearthing better  OBJECTIVE:   Vitals:   Filed Vitals:   05/08/14 2037 05/08/14 2225 05/09/14 0624 05/09/14 1014  BP: 126/84 128/72 124/68 132/74  Pulse: 68 72 78 70  Temp: 97.9 F (36.6 C) 97.8 F (36.6 C) 97.4 F (36.3 C)   TempSrc: Oral Oral Oral   Resp: 20 20 18    Height: 5\' 9"  (1.753 m)     Weight: 200 lb 4.8 oz (90.855 kg)  200 lb 2.1 oz (90.78 kg)   SpO2: 98% 100% 100%    I&O's:   Intake/Output Summary (Last 24 hours) at 05/09/14 1042 Last data filed at 05/09/14 6433  Gross per 24 hour  Intake    480 ml  Output   1775 ml  Net  -1295 ml   TELEMETRY: Reviewed telemetry pt in paced:     PHYSICAL EXAM General: Well developed, well nourished, in no acute distress Head:   Normal cephalic and atramatic  Lungs:   Clear bilaterally to auscultation. Heart:   HRRR S1 S2  No JVD.   Abdomen: abdomen soft and non-tender Msk:  Back normal,  Normal strength and tone for age. Extremities:   No edema.   Neuro: Alert and oriented. Psych:  Normal affect, responds appropriately   LABS: Basic Metabolic Panel:  Recent Labs  05/08/14 1430 05/09/14 0405  NA 136* 137  K 4.4 4.4  CL 98 96  CO2 20 24  GLUCOSE 102* 84  BUN 20 19  CREATININE 0.84 1.00  CALCIUM 9.2 8.9   Liver Function Tests:  Recent Labs  05/09/14 0405  AST 34  ALT 22  ALKPHOS 60  BILITOT 1.5*  PROT 7.0  ALBUMIN 3.6   No results found for this basename: LIPASE, AMYLASE,  in the last 72 hours CBC:  Recent Labs  05/08/14 1430  WBC 8.3  HGB 17.1*  HCT 49.0  MCV 93.2  PLT 129*   Cardiac Enzymes: No results found for this basename: CKTOTAL, CKMB, CKMBINDEX, TROPONINI,  in the last 72 hours BNP: No components found with this basename: POCBNP,  D-Dimer: No results found for this basename: DDIMER,  in the last 72 hours Hemoglobin A1C: No results found for this basename: HGBA1C,  in the last 72 hours Fasting Lipid Panel: No results found for this basename: CHOL,  HDL, LDLCALC, TRIG, CHOLHDL, LDLDIRECT,  in the last 72 hours Thyroid Function Tests: No results found for this basename: TSH, T4TOTAL, FREET3, T3FREE, THYROIDAB,  in the last 72 hours Anemia Panel: No results found for this basename: VITAMINB12, FOLATE, FERRITIN, TIBC, IRON, RETICCTPCT,  in the last 72 hours Coag Panel:   Lab Results  Component Value Date   INR 1.06 11/05/2012    RADIOLOGY: Dg Chest 2 View  05/08/2014   CLINICAL DATA:  Hypertension.  EXAM: CHEST  2 VIEW  COMPARISON:  11/06/2012.  FINDINGS: Mediastinum and hilar structures normal. Cardiac pacer with tips in right atrium and right ventricle. Cardiomegaly with mild pulmonary vascular prominence noted. Mild infiltrate and/or atelectasis right lung base. Small right pleural effusion. No pneumothorax. No acute osseous abnormality.  IMPRESSION: 1. Severe cardiomegaly. Cardiac pacer noted with lead tips in right atrium and right ventricle. Mild pulmonary venous congestion . 2. Right lower lobe atelectasis and/or infiltrate. Associated right pleural effusion.   Electronically Signed   By: Navesink   On: 05/08/2014 16:45   Ct Chest W Contrast  05/08/2014   CLINICAL DATA:  Progressive shortness of  breath. Right pleural effusion.  EXAM: CT CHEST WITH CONTRAST  TECHNIQUE: Multidetector CT imaging of the chest was performed during intravenous contrast administration.  CONTRAST:  77mL OMNIPAQUE IOHEXOL 300 MG/ML  SOLN  COMPARISON:  Chest x-ray dated 05/08/2014  FINDINGS: There is new cardiomegaly with a large right pleural effusion and a small left effusion. There is compressive atelectasis in the right lower lobe. There is a small pericardial effusion. The patient has extensive calcifications in the coronary arteries. No hilar or mediastinal adenopathy.  No acute osseous abnormality. Anterior osteophytes fuse most of the thoracic spine.  IMPRESSION: New cardiomegaly with bilateral pleural effusions, large on the right and small on the left.  Small pericardial effusion.   Electronically Signed   By: Rozetta Nunnery M.D.   On: 05/08/2014 18:37      ASSESSMENT: Derek Odonnell:  Volume overload, DOE  1) Will try to differentiate between systolic vs. Diastolic heart failure.  Derek Odonnell has responded well to diuresis.  If his EF is low, Derek Odonnell will need LHC.  I do not think Derek Odonnell will stay the weekend to have a cath Monday if needed.  If Derek Odonnell goes home, will need to go home on diuretic therapy.  Switch to Furosemide 40 mg BID PO.  2) Derek Odonnell has known left shoulder impingement.  Normal treated at [facility].  Derek Odonnell requests to see an orthopedist for cortisone injection in the left shoulder, so that Derek Odonnell does not have to go to [facility]  3) AFib. COntinue aspirin.  Jettie Booze., MD  05/09/2014  10:42 AM

## 2014-05-15 NOTE — Discharge Instructions (Signed)
Radial Site Care °Refer to this sheet in the next few weeks. These instructions provide you with information on caring for yourself after your procedure. Your caregiver may also give you more specific instructions. Your treatment has been planned according to current medical practices, but problems sometimes occur. Call your caregiver if you have any problems or questions after your procedure. °HOME CARE INSTRUCTIONS °· You may shower the day after the procedure. Remove the bandage (dressing) and gently wash the site with plain soap and water. Gently pat the site dry. °· Do not apply powder or lotion to the site. °· Do not submerge the affected site in water for 3 to 5 days. °· Inspect the site at least twice daily. °· Do not flex or bend the affected arm for 24 hours. °· No lifting over 5 pounds (2.3 kg) for 5 days after your procedure. °· Do not drive home if you are discharged the same day of the procedure. Have someone else drive you. °· You may drive 24 hours after the procedure unless otherwise instructed by your caregiver. °· Do not operate machinery or power tools for 24 hours. °· A responsible adult should be with you for the first 24 hours after you arrive home. °What to expect: °· Any bruising will usually fade within 1 to 2 weeks. °· Blood that collects in the tissue (hematoma) may be painful to the touch. It should usually decrease in size and tenderness within 1 to 2 weeks. °SEEK IMMEDIATE MEDICAL CARE IF: °· You have unusual pain at the radial site. °· You have redness, warmth, swelling, or pain at the radial site. °· You have drainage (other than a small amount of blood on the dressing). °· You have chills. °· You have a fever or persistent symptoms for more than 72 hours. °· You have a fever and your symptoms suddenly get worse. °· Your arm becomes pale, cool, tingly, or numb. °· You have heavy bleeding from the site. Hold pressure on the site. °Document Released: 10/29/2010 Document Revised:  12/19/2011 Document Reviewed: 10/29/2010 °ExitCare® Patient Information ©2015 [employer]. This information is not intended to replace advice given to you by your health care provider. Make sure you discuss any questions you have with your health care provider. ° °

## 2014-05-15 NOTE — CV Procedure (Addendum)
       PROCEDURE:  Left heart catheterization with selective coronary angiography, left ventriculogram.  INDICATIONS:  Cardiomyopathy  The risks, benefits, and details of the procedure were explained to the patient.  The patient verbalized understanding and wanted to proceed.  Informed written consent was obtained.  PROCEDURE TECHNIQUE:  After Xylocaine anesthesia a 65F slender sheath was placed in the right radial artery with a single anterior needle wall stick.   Right coronary angiography was done using a Judkins R4 guide catheter.  Left coronary angiography was done using a Judkins L3.5 guide catheter.  Left ventriculography was done using a pigtail catheter.  A TR band was used for hemostasis.   CONTRAST:  Total of 90 cc.  COMPLICATIONS:  None.    HEMODYNAMICS:  Aortic pressure was 107/63; LV pressure was 108/11; LVEDP 15.  There was no gradient between the left ventricle and aorta.    ANGIOGRAPHIC DATA:   The left main coronary artery is widely patent.  The left anterior descending artery is a large vessel which reaches the apex. There is an eccentric 60-70% lesion in the mid vessel, which appears worse in the caudal views. There is mild, diffuse disease in the remainder of the LAD. There is a medium-sized diagonal which has mild disease..  The left circumflex artery is a large vessel. There is mild disease proximally. The first obtuse marginal is small but patent. The second obtuse marginal is large. This is also widely patent. Just after the origin of the second obtuse marginal, the circumflex is occluded.  The right coronary artery is a large dominant vessel. There is moderate, diffuse disease in the mid vessel. There is mild disease proximally. In the distal RCA, there is a severe, 80%, heavily calcified lesion.  The posterior lateral artery is a large vessel is patent. Posterior descending is medium-sized and patent. There are collaterals from the posterior lateral artery to the  distal circumflex system.  LEFT VENTRICULOGRAM:  Left ventricular angiogram was done in the 30 RAO projection and revealed severely decreased systolic function globally with an estimated ejection fraction of 25 %.  Apical akinesis. LVEDP was 12 mmHg.  IMPRESSIONS:  1. Patent left main coronary artery. 2. Moderate to severe disease in the mid left anterior descending artery. 3. Occluded mid left circumflex artery with right-to-left collaterals filling the distal circumflex system. 4. Severe disease in the distal right coronary artery. 5. Severely decreased left ventricular systolic function.  LVEDP 12 mmHg.  Ejection fraction 25%.  RECOMMENDATION:  The patient denies any angina. Continue medical therapy for now.  Will reassess left ventricular function in the future. He appears well compensated.  I will talk with Dr. Lovena Le as well.  He would require rotational or orbital atherectomy for percutaneous revascularization of the RCA. The circumflex occlusion is favorable for a CTO PCI.  If the LAD lesion was found to be significant, this could also be treated percutaneously.    I spoke at length with the patient's family along with the patient. We discussed options for revascularization. We'll start with medical therapy. Given his lack of symptoms, he is not interested in bypass surgery. He feels like he is doing reasonably well for the most part except for some dyspnea on exertion. He is not feel his symptoms are bad enough to consider bypass surgery. Will consider PCI later time after more medical therapy.

## 2014-05-19 ENCOUNTER — Telehealth: Payer: Self-pay | Admitting: Interventional Cardiology

## 2014-05-19 DIAGNOSIS — I519 Heart disease, unspecified: Secondary | ICD-10-CM

## 2014-05-19 DIAGNOSIS — I5021 Acute systolic (congestive) heart failure: Secondary | ICD-10-CM

## 2014-05-19 NOTE — Telephone Encounter (Signed)
°  Patient wants to speak Dr Irish Lack regarding treatment. Only wants to speak with Dr Irish Lack. Please call and advise.

## 2014-05-19 NOTE — Telephone Encounter (Signed)
To Dr. Varanasi. 

## 2014-05-20 NOTE — Telephone Encounter (Signed)
Spoke with pt and he would like for Dr. Irish Lack to call Dr. Lennie Odor (in [place]). His personal cell number is [phone].

## 2014-05-20 NOTE — Telephone Encounter (Addendum)
Pt also wants to know when he should follow up, but he wants Dr. Irish Lack to speak with Dr. Olen Pel first before he does anything further. Pt also wants to know when he can go back to exercising.

## 2014-05-21 NOTE — Telephone Encounter (Signed)
I will call him and we will come up with a plan.

## 2014-05-26 ENCOUNTER — Ambulatory Visit (INDEPENDENT_AMBULATORY_CARE_PROVIDER_SITE_OTHER): Payer: Medicare Other | Admitting: *Deleted

## 2014-05-26 DIAGNOSIS — I442 Atrioventricular block, complete: Secondary | ICD-10-CM

## 2014-05-26 NOTE — Telephone Encounter (Signed)
Spoke with pt and he has a lot of questions about what the plan is. He wants to know if he can exercise and go play golf. Pt denies chest pain, dizziness and SOB. Pt is adamant that Dr. Irish Lack speak with Dr. Olen Pel soon and go over everything with him. I told pt that we are working on getting in touch with Dr. Olen Pel and that I would be in touch.

## 2014-05-26 NOTE — Telephone Encounter (Signed)
I left a message for Dr. Olen Pel.  He can play golf if he feels up to it.  Would just walk now for exercise.  Would not be outside for long periods of time if the temp is over 80 degrees

## 2014-05-26 NOTE — Telephone Encounter (Signed)
Patient would like to know what the plan is with Dr. Olen Pel. Please call and advise.

## 2014-05-26 NOTE — Progress Notes (Signed)
Remote pacemaker transmission.   

## 2014-05-26 NOTE — Telephone Encounter (Signed)
Pt notified. I let pt know that I will be in touch once Dr. Irish Lack speaks with Dr. Olen Pel.

## 2014-05-27 LAB — MDC_IDC_ENUM_SESS_TYPE_REMOTE
Battery Remaining Longevity: 100 mo
Brady Statistic AP VS Percent: 0 %
Brady Statistic AS VS Percent: 0 %
Brady Statistic RA Percent Paced: 1 %
Brady Statistic RV Percent Paced: 97 %
Date Time Interrogation Session: 20150817071246
Implantable Pulse Generator Model: 2210
Lead Channel Impedance Value: 810 Ohm
Lead Channel Sensing Intrinsic Amplitude: 12 mV
Lead Channel Setting Pacing Amplitude: 2.5 V
Lead Channel Setting Pacing Pulse Width: 0.5 ms
Lead Channel Setting Sensing Sensitivity: 2 mV
MDC IDC MSMT BATTERY REMAINING PERCENTAGE: 95.5 %
MDC IDC MSMT BATTERY VOLTAGE: 2.99 V
MDC IDC MSMT LEADCHNL RA IMPEDANCE VALUE: 430 Ohm
MDC IDC PG SERIAL: 7441572
MDC IDC SET LEADCHNL RA PACING AMPLITUDE: 3.5 V
MDC IDC STAT BRADY AP VP PERCENT: 0 %
MDC IDC STAT BRADY AS VP PERCENT: 0 %

## 2014-05-28 NOTE — Telephone Encounter (Addendum)
Per Dr. Irish Lack he spoke with Dr. Olen Pel regarding pts care and pt will need to be set up for CTO PCI on 06/18/14. He will also need echocardiogram to reassess LV function prior to CTO.

## 2014-06-04 ENCOUNTER — Encounter: Payer: Self-pay | Admitting: Cardiology

## 2014-06-04 NOTE — Telephone Encounter (Signed)
Pt notified of CTO scheduled for 06/18/14, instructions given. Pt will have labs and echo 06/05/14.

## 2014-06-05 ENCOUNTER — Encounter: Payer: Self-pay | Admitting: Cardiology

## 2014-06-05 ENCOUNTER — Ambulatory Visit (HOSPITAL_COMMUNITY): Payer: Medicare Other | Attending: Cardiovascular Disease | Admitting: Radiology

## 2014-06-05 ENCOUNTER — Other Ambulatory Visit (INDEPENDENT_AMBULATORY_CARE_PROVIDER_SITE_OTHER): Payer: Medicare Other

## 2014-06-05 DIAGNOSIS — I4891 Unspecified atrial fibrillation: Secondary | ICD-10-CM | POA: Insufficient documentation

## 2014-06-05 DIAGNOSIS — R9439 Abnormal result of other cardiovascular function study: Secondary | ICD-10-CM | POA: Insufficient documentation

## 2014-06-05 DIAGNOSIS — I519 Heart disease, unspecified: Secondary | ICD-10-CM

## 2014-06-05 DIAGNOSIS — I5021 Acute systolic (congestive) heart failure: Secondary | ICD-10-CM

## 2014-06-05 DIAGNOSIS — I509 Heart failure, unspecified: Secondary | ICD-10-CM

## 2014-06-05 DIAGNOSIS — I10 Essential (primary) hypertension: Secondary | ICD-10-CM | POA: Insufficient documentation

## 2014-06-05 LAB — BASIC METABOLIC PANEL
BUN: 18 mg/dL (ref 6–23)
CHLORIDE: 103 meq/L (ref 96–112)
CO2: 24 meq/L (ref 19–32)
Calcium: 9.2 mg/dL (ref 8.4–10.5)
Creatinine, Ser: 0.8 mg/dL (ref 0.4–1.5)
GFR: 96.15 mL/min (ref >60.00)
GLUCOSE: 92 mg/dL (ref 70–99)
POTASSIUM: 4.2 meq/L (ref 3.5–5.1)
SODIUM: 134 meq/L — AB (ref 135–145)

## 2014-06-05 LAB — CBC WITH DIFFERENTIAL/PLATELET
BASOS ABS: 0.1 10*3/uL (ref 0.0–0.1)
Basophils Relative: 1.1 % (ref 0.0–3.0)
EOS PCT: 5 % (ref 0.0–5.0)
Eosinophils Absolute: 0.3 10*3/uL (ref 0.0–0.7)
HEMATOCRIT: 49.1 % (ref 39.0–52.0)
Hemoglobin: 16.6 g/dL (ref 13.0–17.0)
LYMPHS ABS: 1.2 10*3/uL (ref 0.7–4.0)
LYMPHS PCT: 17 % (ref 12.0–46.0)
MCHC: 33.8 g/dL (ref 30.0–36.0)
MCV: 92.6 fl (ref 78.0–100.0)
MONOS PCT: 9.8 % (ref 3.0–12.0)
Monocytes Absolute: 0.7 10*3/uL (ref 0.1–1.0)
Neutro Abs: 4.6 10*3/uL (ref 1.4–7.7)
Neutrophils Relative %: 67.1 % (ref 43.0–77.0)
PLATELETS: 129 10*3/uL — AB (ref 150.0–400.0)
RBC: 5.3 Mil/uL (ref 4.22–5.81)
RDW: 14.3 % (ref 11.5–15.5)
WBC: 6.9 10*3/uL (ref 4.0–10.5)

## 2014-06-05 LAB — PROTIME-INR
INR: 1.1 ratio — ABNORMAL HIGH (ref 0.8–1.0)
PROTHROMBIN TIME: 12 s (ref 9.6–13.1)

## 2014-06-05 NOTE — Progress Notes (Signed)
Echocardiogram performed.  

## 2014-06-09 ENCOUNTER — Other Ambulatory Visit: Payer: Self-pay | Admitting: Cardiology

## 2014-06-09 DIAGNOSIS — I513 Intracardiac thrombosis, not elsewhere classified: Secondary | ICD-10-CM

## 2014-06-10 ENCOUNTER — Other Ambulatory Visit (HOSPITAL_COMMUNITY): Payer: Medicare Other

## 2014-06-11 ENCOUNTER — Ambulatory Visit (HOSPITAL_COMMUNITY): Payer: Medicare Other | Attending: Interventional Cardiology | Admitting: Cardiology

## 2014-06-11 ENCOUNTER — Other Ambulatory Visit (HOSPITAL_COMMUNITY): Payer: Medicare Other | Admitting: *Deleted

## 2014-06-11 DIAGNOSIS — I442 Atrioventricular block, complete: Secondary | ICD-10-CM | POA: Insufficient documentation

## 2014-06-11 DIAGNOSIS — I509 Heart failure, unspecified: Secondary | ICD-10-CM | POA: Insufficient documentation

## 2014-06-11 DIAGNOSIS — I10 Essential (primary) hypertension: Secondary | ICD-10-CM | POA: Insufficient documentation

## 2014-06-11 DIAGNOSIS — I4891 Unspecified atrial fibrillation: Secondary | ICD-10-CM | POA: Insufficient documentation

## 2014-06-11 DIAGNOSIS — I5189 Other ill-defined heart diseases: Secondary | ICD-10-CM | POA: Diagnosis present

## 2014-06-11 DIAGNOSIS — R0609 Other forms of dyspnea: Secondary | ICD-10-CM | POA: Diagnosis not present

## 2014-06-11 DIAGNOSIS — I513 Intracardiac thrombosis, not elsewhere classified: Secondary | ICD-10-CM

## 2014-06-11 DIAGNOSIS — Z950 Presence of cardiac pacemaker: Secondary | ICD-10-CM | POA: Diagnosis not present

## 2014-06-11 DIAGNOSIS — R0989 Other specified symptoms and signs involving the circulatory and respiratory systems: Secondary | ICD-10-CM | POA: Insufficient documentation

## 2014-06-11 MED ORDER — PERFLUTREN PROTEIN A MICROSPH IV SUSP
3.0000 mL | Freq: Once | INTRAVENOUS | Status: AC
  Administered 2014-06-11: 3 mL via INTRAVENOUS

## 2014-06-11 NOTE — Progress Notes (Signed)
Limited echo with contrast to rule out thombus .

## 2014-06-12 ENCOUNTER — Encounter: Payer: Self-pay | Admitting: Internal Medicine

## 2014-06-12 ENCOUNTER — Encounter (HOSPITAL_COMMUNITY): Payer: Self-pay | Admitting: Pharmacy Technician

## 2014-06-12 ENCOUNTER — Telehealth: Payer: Self-pay | Admitting: Cardiology

## 2014-06-12 MED ORDER — CLOPIDOGREL BISULFATE 75 MG PO TABS: ORAL_TABLET | ORAL | Status: DC

## 2014-06-12 NOTE — Telephone Encounter (Signed)
Pt notified Rx sent in.

## 2014-06-12 NOTE — Telephone Encounter (Signed)
Message copied by Alcario Drought on Thu Jun 12, 2014 10:06 AM ------      Message from: Jettie Booze      Created: Thu Jun 12, 2014  8:53 AM       OK to take 300 mg today on first day, then PLavix 75 mg daily starting tomorrow. ------

## 2014-06-18 ENCOUNTER — Encounter (HOSPITAL_COMMUNITY)
Admission: RE | Disposition: A | Discharge status: Home / Self Care | Payer: Self-pay | Source: Ambulatory Visit | Attending: Interventional Cardiology

## 2014-06-18 ENCOUNTER — Ambulatory Visit (HOSPITAL_COMMUNITY)
Admission: RE | Admit: 2014-06-18 | Discharge: 2014-06-19 | Disposition: A | Discharge status: Home / Self Care | Payer: Medicare Other | Source: Ambulatory Visit | Attending: Interventional Cardiology | Admitting: Interventional Cardiology

## 2014-06-18 ENCOUNTER — Encounter (HOSPITAL_COMMUNITY): Payer: Self-pay | Admitting: Interventional Cardiology

## 2014-06-18 DIAGNOSIS — Z8505 Personal history of malignant neoplasm of liver: Secondary | ICD-10-CM | POA: Insufficient documentation

## 2014-06-18 DIAGNOSIS — I4891 Unspecified atrial fibrillation: Secondary | ICD-10-CM | POA: Diagnosis present

## 2014-06-18 DIAGNOSIS — Z9089 Acquired absence of other organs: Secondary | ICD-10-CM | POA: Diagnosis not present

## 2014-06-18 DIAGNOSIS — I5022 Chronic systolic (congestive) heart failure: Secondary | ICD-10-CM | POA: Diagnosis not present

## 2014-06-18 DIAGNOSIS — R55 Syncope and collapse: Secondary | ICD-10-CM | POA: Insufficient documentation

## 2014-06-18 DIAGNOSIS — Z7902 Long term (current) use of antithrombotics/antiplatelets: Secondary | ICD-10-CM | POA: Diagnosis not present

## 2014-06-18 DIAGNOSIS — Z85038 Personal history of other malignant neoplasm of large intestine: Secondary | ICD-10-CM | POA: Insufficient documentation

## 2014-06-18 DIAGNOSIS — I10 Essential (primary) hypertension: Secondary | ICD-10-CM | POA: Diagnosis not present

## 2014-06-18 DIAGNOSIS — I2582 Chronic total occlusion of coronary artery: Secondary | ICD-10-CM | POA: Diagnosis not present

## 2014-06-18 DIAGNOSIS — I509 Heart failure, unspecified: Secondary | ICD-10-CM | POA: Insufficient documentation

## 2014-06-18 DIAGNOSIS — I442 Atrioventricular block, complete: Secondary | ICD-10-CM | POA: Diagnosis not present

## 2014-06-18 DIAGNOSIS — I428 Other cardiomyopathies: Secondary | ICD-10-CM | POA: Diagnosis not present

## 2014-06-18 DIAGNOSIS — R0609 Other forms of dyspnea: Secondary | ICD-10-CM | POA: Diagnosis present

## 2014-06-18 DIAGNOSIS — I480 Paroxysmal atrial fibrillation: Secondary | ICD-10-CM

## 2014-06-18 DIAGNOSIS — Z950 Presence of cardiac pacemaker: Secondary | ICD-10-CM | POA: Insufficient documentation

## 2014-06-18 DIAGNOSIS — F17200 Nicotine dependence, unspecified, uncomplicated: Secondary | ICD-10-CM | POA: Diagnosis not present

## 2014-06-18 DIAGNOSIS — Z7982 Long term (current) use of aspirin: Secondary | ICD-10-CM | POA: Insufficient documentation

## 2014-06-18 DIAGNOSIS — I2510 Atherosclerotic heart disease of native coronary artery without angina pectoris: Secondary | ICD-10-CM | POA: Diagnosis present

## 2014-06-18 DIAGNOSIS — I25119 Atherosclerotic heart disease of native coronary artery with unspecified angina pectoris: Secondary | ICD-10-CM

## 2014-06-18 HISTORY — DX: Atherosclerotic heart disease of native coronary artery without angina pectoris: I25.10

## 2014-06-18 HISTORY — PX: PERCUTANEOUS CORONARY STENT INTERVENTION (PCI-S): SHX5485

## 2014-06-18 HISTORY — DX: Shortness of breath: R06.02

## 2014-06-18 HISTORY — PX: PERCUTANEOUS CORONARY INTERVENTION-BALLOON ONLY: SHX6014

## 2014-06-18 HISTORY — PX: CORONARY STENT PLACEMENT: SHX1402

## 2014-06-18 HISTORY — DX: Chronic systolic (congestive) heart failure: I50.22

## 2014-06-18 HISTORY — DX: Other cardiomyopathies: I42.8

## 2014-06-18 HISTORY — PX: CARDIAC CATHETERIZATION: SHX172

## 2014-06-18 LAB — POCT ACTIVATED CLOTTING TIME
ACTIVATED CLOTTING TIME: 287 s
ACTIVATED CLOTTING TIME: 247 s
ACTIVATED CLOTTING TIME: 326 s
Activated Clotting Time: 242 seconds
Activated Clotting Time: 501 seconds
Activated Clotting Time: 507 seconds

## 2014-06-18 SURGERY — PERCUTANEOUS CORONARY STENT INTERVENTION (PCI-S)
Anesthesia: LOCAL

## 2014-06-18 MED ORDER — ADENOSINE 12 MG/4ML IV SOLN
16.0000 mL | Freq: Once | INTRAVENOUS | Status: DC
  Filled 2014-06-18: qty 16

## 2014-06-18 MED ORDER — SODIUM CHLORIDE 0.9 % IJ SOLN
3.0000 mL | INTRAMUSCULAR | Status: DC | PRN
  Administered 2014-06-18: 3 mL via INTRAVENOUS

## 2014-06-18 MED ORDER — CLOPIDOGREL BISULFATE 300 MG PO TABS
ORAL_TABLET | ORAL | Status: AC
  Filled 2014-06-18: qty 1

## 2014-06-18 MED ORDER — MIDAZOLAM HCL 2 MG/2ML IJ SOLN
INTRAMUSCULAR | Status: AC
  Filled 2014-06-18: qty 2

## 2014-06-18 MED ORDER — SODIUM CHLORIDE 0.9 % IV SOLN
1.0000 mL/kg/h | INTRAVENOUS | Status: AC
  Administered 2014-06-18: 16:00:00 1 mL/kg/h via INTRAVENOUS

## 2014-06-18 MED ORDER — SODIUM CHLORIDE 0.9 % IJ SOLN: 3.0000 mL | Freq: Two times a day (BID) | INTRAMUSCULAR | Status: DC

## 2014-06-18 MED ORDER — CARVEDILOL 3.125 MG PO TABS
3.1250 mg | ORAL_TABLET | Freq: Two times a day (BID) | ORAL | Status: DC
  Filled 2014-06-18 (×3): qty 1

## 2014-06-18 MED ORDER — HEPARIN (PORCINE) IN NACL 2-0.9 UNIT/ML-% IJ SOLN
INTRAMUSCULAR | Status: AC
  Filled 2014-06-18: qty 1000

## 2014-06-18 MED ORDER — ASPIRIN 81 MG PO CHEW
CHEWABLE_TABLET | ORAL | Status: AC
  Filled 2014-06-18: qty 1

## 2014-06-18 MED ORDER — ASPIRIN 81 MG PO CHEW
81.0000 mg | CHEWABLE_TABLET | Freq: Every day | ORAL | Status: DC
  Filled 2014-06-18: qty 1

## 2014-06-18 MED ORDER — FENTANYL CITRATE 0.05 MG/ML IJ SOLN
INTRAMUSCULAR | Status: AC
  Filled 2014-06-18: qty 2

## 2014-06-18 MED ORDER — ACETAMINOPHEN 325 MG PO TABS: 650.0000 mg | ORAL_TABLET | ORAL | Status: DC | PRN

## 2014-06-18 MED ORDER — VERAPAMIL HCL 2.5 MG/ML IV SOLN
INTRAVENOUS | Status: AC
  Filled 2014-06-18: qty 2

## 2014-06-18 MED ORDER — SODIUM CHLORIDE 0.9 % IV SOLN
INTRAVENOUS | Status: DC
  Administered 2014-06-18: 08:00:00 via INTRAVENOUS

## 2014-06-18 MED ORDER — LISINOPRIL 5 MG PO TABS
15.0000 mg | ORAL_TABLET | Freq: Every day | ORAL | Status: DC
  Filled 2014-06-18: qty 1

## 2014-06-18 MED ORDER — CLOPIDOGREL BISULFATE 75 MG PO TABS
75.0000 mg | ORAL_TABLET | Freq: Every day | ORAL | Status: DC
  Filled 2014-06-18: qty 1

## 2014-06-18 MED ORDER — POTASSIUM CHLORIDE ER 10 MEQ PO TBCR
10.0000 meq | EXTENDED_RELEASE_TABLET | Freq: Every day | ORAL | Status: DC
  Filled 2014-06-18: qty 1

## 2014-06-18 MED ORDER — CLOPIDOGREL BISULFATE 75 MG PO TABS: 75.0000 mg | ORAL_TABLET | Freq: Every day | ORAL | Status: DC

## 2014-06-18 MED ORDER — ONDANSETRON HCL 4 MG/2ML IJ SOLN: 4.0000 mg | Freq: Four times a day (QID) | INTRAMUSCULAR | Status: DC | PRN

## 2014-06-18 MED ORDER — ASPIRIN 81 MG PO CHEW: 81.0000 mg | CHEWABLE_TABLET | ORAL | Status: DC

## 2014-06-18 MED ORDER — HEPARIN SODIUM (PORCINE) 1000 UNIT/ML IJ SOLN
INTRAMUSCULAR | Status: AC
  Filled 2014-06-18: qty 1

## 2014-06-18 MED ORDER — LIDOCAINE HCL (PF) 1 % IJ SOLN
INTRAMUSCULAR | Status: AC
  Filled 2014-06-18: qty 30

## 2014-06-18 MED ORDER — FAMOTIDINE IN NACL 20-0.9 MG/50ML-% IV SOLN
INTRAVENOUS | Status: AC
  Filled 2014-06-18: qty 50

## 2014-06-18 MED ORDER — SODIUM CHLORIDE 0.9 % IV SOLN: 250.0000 mL | INTRAVENOUS | Status: DC | PRN

## 2014-06-18 NOTE — Interval H&P Note (Signed)
Cath Lab Visit (complete for each Cath Lab visit)  Clinical Evaluation Leading to the Procedure:   ACS: No.  Non-ACS:    Anginal Classification: CCS III  Anti-ischemic medical therapy: Maximal Therapy (2 or more classes of medications)  Non-Invasive Test Results: No non-invasive testing performed  Prior CABG: No previous CABG      History and Physical Interval Note: Patient with heart failure and multivessel disease.  Procedure discussed with patient and family again this morning.  He is willing to proceed.  Risks explained.  Questions answered.  He has tolerated Plavix.    06/18/2014 8:56 AM  Brown Human  has presented today for surgery, with the diagnosis of cad/chf  The various methods of treatment have been discussed with the patient and family. After consideration of risks, benefits and other options for treatment, the patient has consented to  Procedure(s): PERCUTANEOUS CORONARY STENT INTERVENTION (PCI-S) (N/A) as a surgical intervention .  The patient's history has been reviewed, patient examined, no change in status, stable for surgery.  I have reviewed the patient's chart and labs.  Questions were answered to the patient's satisfaction.     Balraj Brayfield S.

## 2014-06-18 NOTE — Progress Notes (Signed)
TR BAND REMOVAL  LOCATION:    right radial  DEFLATED PER PROTOCOL:    Yes.    TIME BAND OFF / DRESSING APPLIED:    1530   SITE UPON ARRIVAL:    Level 0  SITE AFTER BAND REMOVAL:    Level 0  REVERSE ALLEN'S TEST:     positive  CIRCULATION SENSATION AND MOVEMENT:    Within Normal Limits   Yes.    COMMENTS:   Tolerated procedure well

## 2014-06-18 NOTE — CV Procedure (Addendum)
PROCEDURE:  Selective coronary angiography, PCI CTO of circumflex; FFR LAD; PCI RCA.  INDICATIONS:  CHF, DOE  The risks, benefits, and details of the procedure were explained to the patient.  The patient verbalized understanding and wanted to proceed.  Informed written consent was obtained.  PROCEDURE TECHNIQUE:  After Xylocaine anesthesia a 8 F sheath was placed in the right femoral artery with a single anterior needle wall stick. A 5 French slender sheath was placed into the right radial artery in a similar fashion.  Left coronary angiography was done using an 8 [place] Voda 3.5 guide catheter through the femoral sheath.  Right coronary angiography was done using a hockey-stick guide catheter through the radial sheath.  IV heparin was given. ACT was used to check that the anticoagulation was therapeutic. Multiple dose of heparin were given. Intracoronary nitroglycerin was given during the intervention. A TR band was used for hemostasis of the right wrist. An 8 [place] Angio-Seal was used for hemostasis in the right groin after femoral angiography.   OPERATORS: Irish Lack, [place]   CONTRAST:  Total of 295 cc.  COMPLICATIONS:  None.    HEMODYNAMICS:  Aortic pressure was 113/68;   ANGIOGRAPHIC DATA:   The left main coronary artery is widely patent.  The left anterior descending artery is a large vessel which wraps around the apex. In the mid vessel, there is a moderate lesion at a bend. There are several small diagonals which are patent. The third diagonal is small and has diffuse disease proximally.  The left circumflex artery is a large vessel. There is mild proximal disease. The first obtuse marginal is patent. The second obtuse marginal is large and has moderate disease proximally. After the second obtuse marginal, there is complete occlusion in the mid circumflex. There are right-to-left collaterals which fill the distal circumflex system.  The right coronary artery is a large, dominant  vessel. In the distal vessel, there is a focal, calcified 90% lesion. There is moderate disease just before the bifurcation of a medium-sized posterior descending artery and posterior lateral artery.  PCI NARRATIVE: Guide catheters and anticoagulation were used as above. Initially, a Fielder wire was advanced to the mid circumflex but did not cross. Subsequently a pilot 200 wire was advanced with a turnpike catheter. This wire did cross into the more distal circumflex. The turnpike catheter was advanced. The pyloric did not move freely. We tried several different wires and eventually, a run through wire did easily maneuvered into the distal circumflex system. Several wires were attempted to wire into a marginal branch, past the initial occlusion, but this was unsuccessful. It was difficult to visualize where the distal wire was since the right-to-left collaterals seem to decrease after crossing the occlusion. Since we could not visualize the distal vessel well, but felt to be difficult to accurately place the stents. Based on how the wire moved, it did appear that we were in true lumen.    Eventually, multiple balloon inflations were performed, first proximally at the initial site of occlusion with a 2.0 x 12 balloon. Angiography revealed opening of the stenosis but poor outflow in the distal vessel. Larger balloons were advanced and inflated throughout the mid circumflex, up to 3 mm in diameter. Flow in the distal vessel did not improve. It did appear that the right-to-left collaterals shot down. There were now some left to left collaterals. The patient had no chest discomfort. ECG showed a left bundle pattern and there were no changes. After multiple  balloon inflations and attempts at getting the wire into a marginal branch rather than the small AV groove circumflex, we stopped this procedure. It's possible that we were subintimal for one stretch of vessel where several of the marginals originated, and then  reentered the circumflex more distally. I did not want to stent in this manner as we would then jail the marginal branches.  At this point, we stopped intervention on the circumflex.  Attention was then turned to the mid LAD. The run through wire was placed across the moderate lesion in the mid LAD. The assist FFR catheter was advanced and normalized at the end of the catheter. The catheter was advanced past the moderate disease. IV adenosine was given to facilitate maximal hyperemia. Resting FFR was 0.96. After adenosine, the FFR decreased to 0.95. This was thought to be nonsignificant. The wires were removed.  Attention was turned to the distal RCA. The run through wire was placed. A 2.0 x 10 cutting balloon could not cross. A 2.0 x 10 Angiosculpt was then used successfully to predilate the calcific stenosis. A 3.0 x 60mm promus drug-eluting stent was deployed successfully. The stent was post dilated with a 3.25 x 12 noncompliant balloon. There was an excellent angiographic result.  IMPRESSIONS:  1.  Widely patent  left main coronary artery. 2.  Moderate disease in the  left anterior descending artery  which was not significant by FFR, and will be medically managed. 3.  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  This was successfully ballooned and there was a 20% residual stenosis at the original occlusion. However, it appears there may have been a more distal occlusion which was limiting run off. 4.  Successful PCI of the distal 90% stenosis of the  right coronary artery with a 3 x 20 mm Promus drug-eluting stent, postdilated to 3.3 mm in diameter.    RECOMMENDATION:   Continue dual antiplatelet therapy for at least a year. Would consider reattempt PCI of the circumflex. Will see how his symptoms in LV function do in the next few months.  He'll be watched overnight.  Anticipate discharge tomorrow.

## 2014-06-18 NOTE — H&P (Addendum)
Derek Odonnell is an 68 y.o. male.   Primary Cardiologist: PMD: Chief Complaint: CHF HPI: 68 y/o man with multivessel CAD and CHF.    Past Medical History  Diagnosis Date  . Hypertension   . Syncope     a. 10/2012 Echo: EF 60-65%, no reg wma, mild MR, mildly dil LA.  . CHB (complete heart block)     a. 10/2012 s/p SJM [address] DR RF DC PPM, ser # [id number].  Marland Kitchen A-fib   . Pacemaker   . Colon cancer metastasized to liver ~ 1988 - 1989    mets to the liver with a liver resection  . Shoulder impingement     "both; flare up q now and then" (05/09/2014)    Past Surgical History  Procedure Laterality Date  . Liver resection  ~ 1989    Liver CA  . Insert / replace / remove pacemaker  11/05/2012  . Colon surgery  ~ 1988    "for CA"  . Refractive surgery Bilateral     Family History  Problem Relation Age of Onset  . Stroke Mother     28s  . Stroke Father 71   Social History:  reports that he has been smoking Cigars.  His smokeless tobacco use includes Chew. He reports that he drinks alcohol. He reports that he does not use illicit drugs.  Allergies: No Known Allergies  Medications Prior to Admission  Medication Sig Dispense Refill  . aspirin 325 MG buffered tablet Take 1 tablet (325 mg total) by mouth daily.      . carvedilol (COREG) 3.125 MG tablet Take 1 tablet (3.125 mg total) by mouth 2 (two) times daily with a meal.  60 tablet  5  . clopidogrel (PLAVIX) 75 MG tablet Take 75 mg by mouth daily.      . furosemide (LASIX) 40 MG tablet Take 1 tablet (40 mg total) by mouth daily.  30 tablet  5  . lisinopril (PRINIVIL,ZESTRIL) 10 MG tablet Take 15 mg by mouth daily.      Marland Kitchen OVER THE COUNTER MEDICATION Take 1 packet by mouth daily. Vitamin packet      . potassium chloride (K-DUR) 10 MEQ tablet Take 1 tablet (10 mEq total) by mouth daily.  30 tablet  5    No results found for this or any previous visit (from the past 48 hour(s)). No results found.  ROS: As above  OBJECTIVE:     Vitals:   Filed Vitals:   06/18/14 0651  BP: 121/73  Pulse: 69  Temp: 97.8 F (36.6 C)  TempSrc: Oral  Resp: 20  Height: 5\' 9"  (1.753 m)  Weight: 200 lb (90.719 kg)  SpO2: 98%   I&O's:  No intake or output data in the 24 hours ending 06/18/14 0734      PHYSICAL EXAM General: Well developed, well nourished, in no acute distress Head:   Normal cephalic and atramatic  Lungs:  Clear bilaterally to auscultation. Heart:   HRRR S1 S2  No JVD.   Abdomen: abdomen soft and non-tender Msk:  Back normal,  Normal strength and tone for age. Extremities:   No edema.   Neuro: Alert and oriented. Psych:  Normal affect, responds appropriately  LABS: Basic Metabolic Panel: No results found for this basename: NA, K, CL, CO2, GLUCOSE, BUN, CREATININE, CALCIUM, MG, PHOS,  in the last 72 hours Liver Function Tests: No results found for this basename: AST, ALT, ALKPHOS, BILITOT, PROT, ALBUMIN,  in the  last 72 hours No results found for this basename: LIPASE, AMYLASE,  in the last 72 hours CBC: No results found for this basename: WBC, NEUTROABS, HGB, HCT, MCV, PLT,  in the last 72 hours Cardiac Enzymes: No results found for this basename: CKTOTAL, CKMB, CKMBINDEX, TROPONINI,  in the last 72 hours BNP: No components found with this basename: POCBNP,  D-Dimer: No results found for this basename: DDIMER,  in the last 72 hours Hemoglobin A1C: No results found for this basename: HGBA1C,  in the last 72 hours Fasting Lipid Panel: No results found for this basename: CHOL, HDL, LDLCALC, TRIG, CHOLHDL, LDLDIRECT,  in the last 72 hours Thyroid Function Tests: No results found for this basename: TSH, T4TOTAL, FREET3, T3FREE, THYROIDAB,  in the last 72 hours Anemia Panel: No results found for this basename: VITAMINB12, FOLATE, FERRITIN, TIBC, IRON, RETICCTPCT,  in the last 72 hours Coag Panel:   Lab Results  Component Value Date   INR 1.1* 06/05/2014   INR 1.2* 05/13/2014   INR 1.06 11/05/2012        Assessment/Plan Plan CTO PCI of left circ.  Will need PCI of RCA as well at some point.  Risks and benefits explained to patient.  He agrees to proceed.  Yalonda Sample S. 06/18/2014, 7:34 AM

## 2014-06-19 DIAGNOSIS — Z950 Presence of cardiac pacemaker: Secondary | ICD-10-CM

## 2014-06-19 DIAGNOSIS — I5022 Chronic systolic (congestive) heart failure: Secondary | ICD-10-CM

## 2014-06-19 DIAGNOSIS — I509 Heart failure, unspecified: Secondary | ICD-10-CM | POA: Diagnosis not present

## 2014-06-19 DIAGNOSIS — I209 Angina pectoris, unspecified: Secondary | ICD-10-CM

## 2014-06-19 DIAGNOSIS — I428 Other cardiomyopathies: Secondary | ICD-10-CM

## 2014-06-19 DIAGNOSIS — I2582 Chronic total occlusion of coronary artery: Secondary | ICD-10-CM | POA: Diagnosis not present

## 2014-06-19 DIAGNOSIS — I10 Essential (primary) hypertension: Secondary | ICD-10-CM

## 2014-06-19 DIAGNOSIS — I2510 Atherosclerotic heart disease of native coronary artery without angina pectoris: Secondary | ICD-10-CM | POA: Diagnosis not present

## 2014-06-19 DIAGNOSIS — I4891 Unspecified atrial fibrillation: Secondary | ICD-10-CM

## 2014-06-19 LAB — CBC
HCT: 39.9 % (ref 39.0–52.0)
Hemoglobin: 13.6 g/dL (ref 13.0–17.0)
MCH: 30.8 pg (ref 26.0–34.0)
MCHC: 34.1 g/dL (ref 30.0–36.0)
MCV: 90.3 fL (ref 78.0–100.0)
Platelets: 108 K/uL — ABNORMAL LOW (ref 150–400)
RBC: 4.42 MIL/uL (ref 4.22–5.81)
RDW: 13.9 % (ref 11.5–15.5)
WBC: 7 K/uL (ref 4.0–10.5)

## 2014-06-19 LAB — BASIC METABOLIC PANEL
Anion gap: 11 (ref 5–15)
BUN: 21 mg/dL (ref 6–23)
CO2: 22 meq/L (ref 19–32)
Calcium: 8.4 mg/dL (ref 8.4–10.5)
Chloride: 104 mEq/L (ref 96–112)
Creatinine, Ser: 0.78 mg/dL (ref 0.50–1.35)
GFR calc Af Amer: >90 mL/min (ref >90)
GFR calc non Af Amer: >90 mL/min (ref >90)
GLUCOSE: 119 mg/dL — AB (ref 70–99)
POTASSIUM: 4.3 meq/L (ref 3.7–5.3)
Sodium: 137 mEq/L (ref 137–147)

## 2014-06-19 MED ORDER — NITROGLYCERIN 0.4 MG SL SUBL: 0.4000 mg | SUBLINGUAL_TABLET | SUBLINGUAL | Status: AC | PRN

## 2014-06-19 NOTE — Progress Notes (Signed)
Subjective: No complaints  Objective: Vital signs in last 24 hours: Temp:  [97.4 F (36.3 C)-98.3 F (36.8 C)] 97.9 F (36.6 C) (09/10 0734) Pulse Rate:  [66-84] 84 (09/10 0734) Resp:  [18-20] 20 (09/10 0734) BP: (98-120)/(56-98) 120/64 mmHg (09/10 0734) SpO2:  [96 %-100 %] 96 % (09/10 0500) Last BM Date: 06/17/14  Intake/Output from previous day: 09/09 0701 - 09/10 0700 In: 1497.6 [P.O.:840; I.V.:657.6] Out: -  Intake/Output this shift: Total I/O In: 120 [P.O.:120] Out: -   Medications Current Facility-Administered Medications  Medication Dose Route Frequency Provider Last Rate Last Dose  . acetaminophen (TYLENOL) tablet 650 mg  650 mg Oral Q4H PRN Jettie Booze, MD      . aspirin chewable tablet 81 mg  81 mg Oral Daily Jettie Booze, MD      . carvedilol (COREG) tablet 3.125 mg  3.125 mg Oral BID WC Jettie Booze, MD      . clopidogrel (PLAVIX) tablet 75 mg  75 mg Oral Q breakfast Jettie Booze, MD      . lisinopril (PRINIVIL,ZESTRIL) tablet 15 mg  15 mg Oral Daily Jettie Booze, MD      . ondansetron [facility]) injection 4 mg  4 mg Intravenous Q6H PRN Jettie Booze, MD      . potassium chloride (K-DUR) CR tablet 10 mEq  10 mEq Oral Daily Jettie Booze, MD        PE: General appearance: alert, cooperative and no distress Lungs: clear to auscultation bilaterally Heart: irregularly irregular rhythm Extremities: No LEE Pulses: 2+ and symmetric Skin: Warm and dry.  radial cath site:  no ecchymosis or hematoma Neurologic: Grossly normal  Lab Results:   Recent Labs  06/19/14 0428  WBC 7.0  HGB 13.6  HCT 39.9  PLT 108*   BMET  Recent Labs  06/19/14 0428  NA 137  K 4.3  CL 104  CO2 22  GLUCOSE 119*  BUN 21  CREATININE 0.78  CALCIUM 8.4    Assessment/Plan  Active Problems:   Atrial fibrillation   Coronary atherosclerosis of native coronary artery   Chronic systolic heart failure   Other primary  cardiomyopathies   Plan: SP left heart cath and successful PCI of the distal 90% stenosis of the right coronary artery with a 3 x 20 mm Promus drug-eluting stent, postdilated to 3.3 mm in diameter.  Moderate disease in the left anterior descending artery which was not significant by FFR, and will be medically managed. Occlusion of the mid left circumflex artery which was successfully ballooned and there was a 20% residual stenosis at the original occlusion. However, it appears there may have been a more distal occlusion which was limiting run off.  Consider reattempt PCI to Circ.  ASA, plavix, lisinopril 15, Coreg 3.125 bid.   BP and HR stable.   Ambulate and DC home.   Follow up with Dr. Irish Lack.       LOS: 1 day    HAGER, BRYAN PA-C 06/19/2014 7:39 AM  I have examined the patient and reviewed assessment and plan and discussed with patient.  Agree with above as stated.  He feels well.  Did have some mild bleeding from the right groin but currently no pain.  BNo problems walking.  Back in AFib.  He has had intermittnet AFib.  He has avoided anticoagulation in teh past.  May have to reconsider given his low EF.  Will discuss at office f/u after his  right groin is better. Palpable right dorsalis pedis pulse.  Najla Aughenbaugh S.

## 2014-06-19 NOTE — Discharge Summary (Signed)
Physician Discharge Summary     Cardiologist:  Irish Lack  Patient ID: LUIE LANEVE MRN: [id number] DOB/AGE: Apr 21, 1946 68 y.o.  Admit date: 06/18/2014 Discharge date: 06/19/2014  Admission Diagnoses:  Coronary atherosclerosis of native coronary artery  Discharge Diagnoses:  Active Problems:   Atrial fibrillation   Coronary atherosclerosis of native coronary artery   Chronic systolic heart failure   Other primary cardiomyopathies   Discharged Condition: stable  Hospital Course:   68 y/o male with a h/o syncope in the setting of afib and complete heart block s/p SJM PPM in 10/2012. Echo at that time showed normal LV function. He has done reasonably well over the past year and a half. He was last seen in clinic in May at which time his pacemaker showed normal device function. He has permanent atrial fibrillation. He is not chronically anticoagulated. He was admitted  At the end of July for IV diuresis.  He was continued on Lisinopril 15mg  daily. 2D echo revealed a decreased EF of 35-40% from prior normal EF in Jan 2014. He was also started on Coreg 3.125mg  BID and continued on PO lasix of 40mg  daily. A low dose of daily potassium was also added.  He was scheduled for LHC on 8/6 which revealed:  Patent left main coronary artery. Moderate to severe disease in the mid left anterior descending artery. Occluded mid left circumflex artery with right-to-left collaterals filling the distal circumflex system. Severe disease in the distal right coronary artery. Severely decreased left ventricular systolic function. LVEDP 12 mmHg. Ejection fraction 25%    He presented this time for repeat LHC which revealed widely patent left main coronary artery. Moderate disease in the left anterior descending artery which was not significant by FFR, and will be medically managed.  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 This was successfully ballooned and there was a 20% residual stenosis at the original  occlusion. However, it appears there may have been a more distal occlusion which was limiting run off. Successful PCI of the distal 90% stenosis of the right coronary artery with a 3 x 20 mm Promus drug-eluting stent, postdilated to 3.3 mm in diameter.   He will be continued on ASA, plavix ACE, BB.  Follow up will be arranged. The patient was seen by Dr. Irish Lack who felt he was stable for DC home.   Consults: Cardiac Rehab  Significant Diagnostic Studies:  PROCEDURE: Selective coronary angiography, PCI CTO of circumflex; FFR LAD; PCI RCA.  INDICATIONS: CHF, DOE  The risks, benefits, and details of the procedure were explained to the patient. The patient verbalized understanding and wanted to proceed. Informed written consent was obtained.  PROCEDURE TECHNIQUE: After Xylocaine anesthesia a 8 F sheath was placed in the right femoral artery with a single anterior needle wall stick. A 5 French slender sheath was placed into the right radial artery in a similar fashion. Left coronary angiography was done using an 8 [place] Voda 3.5 guide catheter through the femoral sheath. Right coronary angiography was done using a hockey-stick guide catheter through the radial sheath. IV heparin was given. ACT was used to check that the anticoagulation was therapeutic. Multiple dose of heparin were given. Intracoronary nitroglycerin was given during the intervention. A TR band was used for hemostasis of the right wrist. An 8 [place] Angio-Seal was used for hemostasis in the right groin after femoral angiography.  OPERATORS: Irish Lack, [place]  CONTRAST: Total of 295 cc.  COMPLICATIONS: None.  HEMODYNAMICS: Aortic pressure was 113/68;  ANGIOGRAPHIC  DATA: The left main coronary artery is widely patent.  The left anterior descending artery is a large vessel which wraps around the apex. In the mid vessel, there is a moderate lesion at a bend. There are several small diagonals which are patent. The third diagonal is small and  has diffuse disease proximally.  The left circumflex artery is a large vessel. There is mild proximal disease. The first obtuse marginal is patent. The second obtuse marginal is large and has moderate disease proximally. After the second obtuse marginal, there is complete occlusion in the mid circumflex. There are right-to-left collaterals which fill the distal circumflex system.  The right coronary artery is a large, dominant vessel. In the distal vessel, there is a focal, calcified 90% lesion. There is moderate disease just before the bifurcation of a medium-sized posterior descending artery and posterior lateral artery.  PCI NARRATIVE: Guide catheters and anticoagulation were used as above. Initially, a Fielder wire was advanced to the mid circumflex but did not cross. Subsequently a pilot 200 wire was advanced with a turnpike catheter. This wire did cross into the more distal circumflex. The turnpike catheter was advanced. The pyloric did not move freely. We tried several different wires and eventually, a run through wire did easily maneuvered into the distal circumflex system. Several wires were attempted to wire into a marginal branch, past the initial occlusion, but this was unsuccessful. It was difficult to visualize where the distal wire was since the right-to-left collaterals seem to decrease after crossing the occlusion. Since we could not visualize the distal vessel well, but felt to be difficult to accurately place the stents. Based on how the wire moved, it did appear that we were in true lumen.  Eventually, multiple balloon inflations were performed, first proximally at the initial site of occlusion with a 2.0 x 12 balloon. Angiography revealed opening of the stenosis but poor outflow in the distal vessel. Larger balloons were advanced and inflated throughout the mid circumflex, up to 3 mm in diameter. Flow in the distal vessel did not improve. It did appear that the right-to-left collaterals shot  down. There were now some left to left collaterals. The patient had no chest discomfort. ECG showed a left bundle pattern and there were no changes. After multiple balloon inflations and attempts at getting the wire into a marginal branch rather than the small AV groove circumflex, we stopped this procedure. It's possible that we were subintimal for one stretch of vessel where several of the marginals originated, and then reentered the circumflex more distally. I did not want to stent in this manner as we would then jail the marginal branches. At this point, we stopped intervention on the circumflex.  Attention was then turned to the mid LAD. The run through wire was placed across the moderate lesion in the mid LAD. The assist FFR catheter was advanced and normalized at the end of the catheter. The catheter was advanced past the moderate disease. IV adenosine was given to facilitate maximal hyperemia. Resting FFR was 0.96. After adenosine, the FFR decreased to 0.95. This was thought to be nonsignificant. The wires were removed.  Attention was turned to the distal RCA. The run through wire was placed. A 2.0 x 10 cutting balloon could not cross. A 2.0 x 10 Angiosculpt was then used successfully to predilate the calcific stenosis. A 3.0 x 31mm promus drug-eluting stent was deployed successfully. The stent was post dilated with a 3.25 x 12 noncompliant balloon. There was an  excellent angiographic result.  IMPRESSIONS:  1. Widely patent left main coronary artery. 2. Moderate disease in the left anterior descending artery which was not significant by FFR, and will be medically managed. 3. 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 This was successfully ballooned and there was a 20% residual stenosis at the original occlusion. However, it appears there may have been a more distal occlusion which was limiting run off. 4. Successful PCI of the distal 90% stenosis of the right coronary artery with a 3 x 20 mm Promus  drug-eluting stent, postdilated to 3.3 mm in diameter.  RECOMMENDATION: Continue dual antiplatelet therapy for at least a year. Would consider reattempt PCI of the circumflex. Will see how his symptoms in LV function do in the next few months. He'll be watched overnight. Anticipate discharge tomorrow.    Treatments: See above  Discharge Exam: Blood pressure 120/64, pulse 84, temperature 97.9 F (36.6 C), temperature source Oral, resp. rate 20, height 5\' 9"  (1.753 m), weight 200 lb (90.719 kg), SpO2 96.00%.   Disposition: 01-Home or Self Care      Discharge Instructions   Diet - low sodium heart healthy    Complete by:  As directed      Discharge instructions    Complete by:  As directed   No lifting with right arm for three days.     Increase activity slowly    Complete by:  As directed             Medication List         aspirin 325 MG buffered tablet  Take 1 tablet (325 mg total) by mouth daily.     carvedilol 3.125 MG tablet  Commonly known as:  COREG  Take 1 tablet (3.125 mg total) by mouth 2 (two) times daily with a meal.     clopidogrel 75 MG tablet  Commonly known as:  PLAVIX  Take 75 mg by mouth daily.     furosemide 40 MG tablet  Commonly known as:  LASIX  Take 1 tablet (40 mg total) by mouth daily.     lisinopril 10 MG tablet  Commonly known as:  PRINIVIL,ZESTRIL  Take 15 mg by mouth daily.     OVER THE COUNTER MEDICATION  Take 1 packet by mouth daily. Vitamin packet     potassium chloride 10 MEQ tablet  Commonly known as:  K-DUR  Take 1 tablet (10 mEq total) by mouth daily.       Follow-up Information   Follow up with Jettie Booze., MD. (The office will call you with the follow up appt date and time. )    Specialty:  Interventional Cardiology   Contact information:   [address] [place] [address] [phone]      Greater than 30 minutes was spent completing the patient's discharge.    SignedTarri Fuller, [place] 06/19/2014, 9:18 AM   I have examined the patient and reviewed assessment and plan and discussed with patient. Agree with above as stated. He feels well. Did have some mild bleeding from the right groin but currently no pain. No problems walking. Back in AFib. He has had intermittnet AFib. He has avoided anticoagulation in teh past. May have to reconsider given his low EF. Will discuss at office f/u after his right groin is better. Palpable right dorsalis pedis pulse.  Daved Mcfann S.

## 2014-06-19 NOTE — Progress Notes (Signed)
CARDIAC REHAB PHASE I   PRE:  Rate/Rhythm: 77 paced  BP:  Supine: 120/64  Sitting:   Standing:    SaO2:   MODE:  Ambulation: 800 ft   POST:  Rate/Rhythm: 100 paced  BP:  Supine:   Sitting: 146/82  Standing:    SaO2:  0755-0859 Pt walked 800 ft with steady gait. Tolerated well. Education completed including smoking cessation since pt occasionally chews. Pt stated would not be any problem to quit as he only does it very infrequently. Gave CHF booklet and reviewed yellow zone of when to call MD since EF low. Gave low sodium diet and stressed daily weights. Discussed CRP 2 and pt declined. He took brochure in case he changes his mind. Pt very active and prefers to ex on his own.   Graylon Good, RN BSN  06/19/2014 8:54 AM

## 2014-07-11 ENCOUNTER — Telehealth: Payer: Self-pay | Admitting: Interventional Cardiology

## 2014-07-11 DIAGNOSIS — I429 Cardiomyopathy, unspecified: Secondary | ICD-10-CM

## 2014-07-11 DIAGNOSIS — I5022 Chronic systolic (congestive) heart failure: Secondary | ICD-10-CM

## 2014-07-11 NOTE — Telephone Encounter (Signed)
Patient needs a TSH, Iron panel, and SPEP and BMet due to cardiomyopathy.  How has BP been running.  May start Spironolactone 25 mg daily to strengthen heart depending on BMet results if BP has not been low.  Derek Odonnell

## 2014-07-14 NOTE — Telephone Encounter (Signed)
OK. Await BMet to determine whether to start Aldactone 25 mg daily.

## 2014-07-14 NOTE — Telephone Encounter (Signed)
Noted  

## 2014-07-14 NOTE — Telephone Encounter (Signed)
Spoke with pt and he checks his BP every once and a while. Bps range from 120-130's/70-80's. Labs ordered and scheduled.

## 2014-07-14 NOTE — Addendum Note (Signed)
Addended byUlla Potash H on: 07/14/2014 08:55 AM   Modules accepted: Orders

## 2014-07-15 ENCOUNTER — Other Ambulatory Visit (INDEPENDENT_AMBULATORY_CARE_PROVIDER_SITE_OTHER): Payer: Medicare Other

## 2014-07-15 DIAGNOSIS — I429 Cardiomyopathy, unspecified: Secondary | ICD-10-CM | POA: Diagnosis not present

## 2014-07-15 DIAGNOSIS — I5022 Chronic systolic (congestive) heart failure: Secondary | ICD-10-CM

## 2014-07-16 LAB — IBC PANEL
Iron: 139 ug/dL (ref 42–165)
Saturation Ratios: 40.2 % (ref 20.0–50.0)
Transferrin: 247.1 mg/dL (ref 212.0–360.0)

## 2014-07-16 LAB — BASIC METABOLIC PANEL
BUN: 24 mg/dL — ABNORMAL HIGH (ref 6–23)
CALCIUM: 9 mg/dL (ref 8.4–10.5)
CO2: 23 meq/L (ref 19–32)
Chloride: 103 mEq/L (ref 96–112)
Creatinine, Ser: 0.9 mg/dL (ref 0.4–1.5)
GFR: 94.82 mL/min (ref >60.00)
Glucose, Bld: 112 mg/dL — ABNORMAL HIGH (ref 70–99)
Potassium: 4.3 mEq/L (ref 3.5–5.1)
Sodium: 134 mEq/L — ABNORMAL LOW (ref 135–145)

## 2014-07-16 LAB — TSH: TSH: 0.94 u[IU]/mL (ref 0.35–4.50)

## 2014-07-17 ENCOUNTER — Telehealth: Payer: Self-pay | Admitting: Cardiology

## 2014-07-17 DIAGNOSIS — Z79899 Other long term (current) drug therapy: Secondary | ICD-10-CM

## 2014-07-17 LAB — PROTEIN ELECTROPHORESIS, SERUM
ALBUMIN ELP: 52.8 % — AB (ref 55.8–66.1)
Alpha-1-Globulin: 7.6 % — ABNORMAL HIGH (ref 2.9–4.9)
Alpha-2-Globulin: 10.8 % (ref 7.1–11.8)
BETA 2: 4.5 % (ref 3.2–6.5)
Beta Globulin: 6.7 % (ref 4.7–7.2)
Gamma Globulin: 17.6 % (ref 11.1–18.8)
TOTAL PROTEIN, SERUM ELECTROPHOR: 7.3 g/dL (ref 6.0–8.3)

## 2014-07-17 MED ORDER — SPIRONOLACTONE 25 MG PO TABS: 25.0000 mg | ORAL_TABLET | Freq: Every day | ORAL | Status: DC

## 2014-07-17 NOTE — Telephone Encounter (Signed)
Message copied by Alcario Drought on Thu Jul 17, 2014 12:47 PM ------      Message from: Jettie Booze      Created: Wed Jul 16, 2014 10:37 PM       Normal thyroid test.  OK to start spironolactone 25 mg daily.  BMet in one week.  Normal iron studies ------

## 2014-07-17 NOTE — Telephone Encounter (Signed)
Pt notified. Rx sent in. Bmet ordered.

## 2014-07-28 ENCOUNTER — Ambulatory Visit: Payer: Medicare Other | Admitting: Interventional Cardiology

## 2014-07-28 ENCOUNTER — Telehealth: Payer: Self-pay | Admitting: Interventional Cardiology

## 2014-07-28 ENCOUNTER — Other Ambulatory Visit: Payer: Medicare Other

## 2014-07-28 NOTE — Telephone Encounter (Signed)
noted 

## 2014-07-28 NOTE — Telephone Encounter (Signed)
New message      Pt is not taking the spironolactone any more.  He says it is causing him to have bad diarrhea.  Please call

## 2014-07-28 NOTE — Telephone Encounter (Signed)
Per pt report - states he had been taking Spironolactone but it is causing him severe uncontrollable diarrhea.  He is no longer going to take the medication.  Pt is aware I will forward this information to MD for review and further orders/recommendations.

## 2014-07-28 NOTE — Telephone Encounter (Signed)
Ok to stop spironolactone.  Could try inspra (eplerenone) to help improve LV function, but will wait for diarrhea to resolve before we consider. Please add spironolactone to Epic list of intolerances.  Thanks.

## 2014-07-29 ENCOUNTER — Telehealth: Payer: Self-pay | Admitting: Interventional Cardiology

## 2014-07-29 NOTE — Telephone Encounter (Signed)
New message      Did Dr Irish Lack say it was ok for pt to stop spirolactone?---He is not taking it anymore because it causes diarrhea.

## 2014-07-29 NOTE — Telephone Encounter (Signed)
Spoke with patients today about Spironolactone. Advised that per Dr. Irish Lack yesterday, he may stop the medication. He is to call us back when diarrhea has completely stopped. He states he had no other GI symptoms associated with the diarrhea. No antibiotics or new foods. He is to call us back when stools are completely normal.

## 2014-08-10 ENCOUNTER — Encounter: Payer: Self-pay | Admitting: Internal Medicine

## 2014-08-27 ENCOUNTER — Ambulatory Visit (INDEPENDENT_AMBULATORY_CARE_PROVIDER_SITE_OTHER): Payer: Medicare Other | Admitting: *Deleted

## 2014-08-27 ENCOUNTER — Encounter: Payer: Self-pay | Admitting: Internal Medicine

## 2014-08-27 DIAGNOSIS — I442 Atrioventricular block, complete: Secondary | ICD-10-CM

## 2014-08-27 LAB — MDC_IDC_ENUM_SESS_TYPE_REMOTE
Battery Remaining Longevity: 98 mo
Battery Remaining Percentage: 95.5 %
Battery Voltage: 2.99 V
Brady Statistic AP VS Percent: 0 %
Brady Statistic RA Percent Paced: 1 %
Brady Statistic RV Percent Paced: 78 %
Date Time Interrogation Session: 20151118074231
Implantable Pulse Generator Serial Number: 7441572
Lead Channel Impedance Value: 380 Ohm
Lead Channel Pacing Threshold Amplitude: 0.5 V
Lead Channel Pacing Threshold Pulse Width: 0.5 ms
Lead Channel Sensing Intrinsic Amplitude: 4 mV
Lead Channel Setting Pacing Amplitude: 2.5 V
Lead Channel Setting Pacing Pulse Width: 0.5 ms
MDC IDC MSMT LEADCHNL RA PACING THRESHOLD PULSEWIDTH: 0.5 ms
MDC IDC MSMT LEADCHNL RV IMPEDANCE VALUE: 840 Ohm
MDC IDC MSMT LEADCHNL RV PACING THRESHOLD AMPLITUDE: 1 V
MDC IDC MSMT LEADCHNL RV SENSING INTR AMPL: 7.2 mV
MDC IDC SET LEADCHNL RA PACING AMPLITUDE: 3.5 V
MDC IDC SET LEADCHNL RV SENSING SENSITIVITY: 2 mV
MDC IDC STAT BRADY AP VP PERCENT: 0 %
MDC IDC STAT BRADY AS VP PERCENT: 0 %
MDC IDC STAT BRADY AS VS PERCENT: 0 %

## 2014-08-27 NOTE — Progress Notes (Signed)
Remote pacemaker transmission.   

## 2014-09-03 ENCOUNTER — Encounter: Payer: Self-pay | Admitting: Cardiology

## 2014-09-18 ENCOUNTER — Encounter (HOSPITAL_COMMUNITY): Payer: Self-pay | Admitting: Internal Medicine

## 2014-09-22 ENCOUNTER — Other Ambulatory Visit: Payer: Medicare Other

## 2014-09-22 ENCOUNTER — Encounter: Payer: Self-pay | Admitting: Interventional Cardiology

## 2014-09-22 ENCOUNTER — Ambulatory Visit (INDEPENDENT_AMBULATORY_CARE_PROVIDER_SITE_OTHER): Payer: Medicare Other | Admitting: Interventional Cardiology

## 2014-09-22 VITALS — BP 138/64 | HR 68 | Ht 69.0 in | Wt 215.8 lb

## 2014-09-22 DIAGNOSIS — I428 Other cardiomyopathies: Secondary | ICD-10-CM

## 2014-09-22 DIAGNOSIS — I429 Cardiomyopathy, unspecified: Secondary | ICD-10-CM

## 2014-09-22 DIAGNOSIS — I25119 Atherosclerotic heart disease of native coronary artery with unspecified angina pectoris: Secondary | ICD-10-CM

## 2014-09-22 DIAGNOSIS — I2510 Atherosclerotic heart disease of native coronary artery without angina pectoris: Secondary | ICD-10-CM

## 2014-09-22 DIAGNOSIS — I10 Essential (primary) hypertension: Secondary | ICD-10-CM

## 2014-09-22 DIAGNOSIS — I5022 Chronic systolic (congestive) heart failure: Secondary | ICD-10-CM

## 2014-09-22 NOTE — Progress Notes (Signed)
Patient ID: Derek Odonnell, male   DOB: July 18, 1946, 68 y.o.   MRN: [id number]    [place], [address] [place], [place]  [address] Phone: [phone] Fax:  [phone]  Date:  09/22/2014   ID:  Derek, Odonnell 1946/01/06, MRN [id number]  PCP:  No PCP Per Patient      History of Present Illness: Derek Odonnell is a 68 y.o. male who has a pacer.  He had a nonischemic cardiomyopathy.  He denies chest pain.  He has played some golf.  He has not been exercising. He has gained weight. He was started on Spiriva lactone to help with his left ventricular dysfunction. He had significant diarrhea with this medication. He stopped the medication. He is tolerating what he is currently taking. He does not want to take any more medication.   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 He knows that he needs to get back into more regular exercise.   Wt Readings from Last 3 Encounters:  09/22/14 215 lb 12.8 oz (97.886 kg)  06/18/14 200 lb (90.719 kg)  05/09/14 200 lb 2.1 oz (90.78 kg)     Past Medical History  Diagnosis Date  . Hypertension   . Syncope     a. 10/2012 Echo: EF 60-65%, no reg wma, mild MR, mildly dil LA.  . CHB (complete heart block)     a. 10/2012 s/p SJM 2210 [address] DR RF DC PPM, ser # [id number].  Marland Kitchen A-fib   . Pacemaker   . Colon cancer metastasized to liver ~ 1988 - 1989    mets to the liver with a liver resection  . Shoulder impingement     "both; flare up q now and then" (05/09/2014)  . Chronic systolic heart failure   . Other primary cardiomyopathies   . Coronary artery disease   . Shortness of breath     " AT TIMES "    Current Outpatient Prescriptions  Medication Sig Dispense Refill  . aspirin 325 MG buffered tablet Take 1 tablet (325 mg total) by mouth daily.    . carvedilol (COREG) 3.125 MG tablet Take 1 tablet (3.125 mg total) by mouth 2 (two) times daily with a meal. 60 tablet 5  .  clopidogrel (PLAVIX) 75 MG tablet Take 75 mg by mouth daily.    . furosemide (LASIX) 40 MG tablet Take 1 tablet (40 mg total) by mouth daily. 30 tablet 5  . lisinopril (PRINIVIL,ZESTRIL) 10 MG tablet Take 15 mg by mouth daily.    . nitroGLYCERIN (NITROSTAT) 0.4 MG SL tablet Place 1 tablet (0.4 mg total) under the tongue every 5 (five) minutes as needed for chest pain. 25 tablet 12  . OVER THE COUNTER MEDICATION Take 1 packet by mouth daily. Vitamin packet    . potassium chloride (K-DUR) 10 MEQ tablet Take 1 tablet (10 mEq total) by mouth daily. 30 tablet 5   No current facility-administered medications for this visit.    Allergies:    Allergies  Allergen Reactions  . Spironolactone Diarrhea    Social History:  The patient  reports that he has been smoking Cigars.  His smokeless tobacco use includes Chew. He reports that he drinks alcohol. He reports that he does not use illicit drugs.   Family History:  The patient's family history includes Stroke in his mother; Stroke (age of onset: 27) in  his father.   ROS:  Please see the history of present illness.  No nausea, vomiting.  No fevers, chills.  No focal weakness.  No dysuria. Weight gain.   All other systems reviewed and negative.   PHYSICAL EXAM: VS:  BP 138/64 mmHg  Pulse 68  Ht 5\' 9"  (1.753 m)  Wt 215 lb 12.8 oz (97.886 kg)  BMI 31.85 kg/m2 General: Well developed, well nourished, in no acute distress HEENT: normal Neck: no JVD, no carotid bruits Cardiac:  normal S1, S2; RRR;  Lungs:  clear to auscultation bilaterally, no wheezing, rhonchi or rales Abd: soft, nontender, no hepatomegaly Ext: no edema Skin: warm and dry Neuro:   no focal abnormalities noted Psych: normal affect     ASSESSMENT AND PLAN:  1. Nonischemic cardiomyopathy: We'll recheck echocardiogram to evaluate for any improvement in LV function. He is tolerating his current medical regimen. He was intolerant of spironolactone.  Check electrolytes  today. 2. Coronary artery disease: No angina. He has a stent in his right coronary artery. He had a chronic total occlusion of the circumflex which was only treated with angioplasty. If he developed anginal symptoms, could consider relook. Moderate lesion in the LAD was not significant by FFR. 3. Follow-up with Dr. Lovena Le regarding pacemaker. If his ejection fraction remains low, may need to consider upgrade to a defibrillator. At this point, he has class I symptoms. He is not really testing himself. If he tries to be more active, he may become more symptomatic. 4. Hypertension: Fairly well controlled. Could consider increasing carvedilol to help with LV dysfunction. He is somewhat reluctant to take any more medication than he is already taking.  Signed, Mina Marble, MD, [facility] 09/22/2014 4:26 PM

## 2014-09-22 NOTE — Patient Instructions (Signed)
Your physician recommends that you continue on your current medications as directed. Please refer to the Current Medication list given to you today.  Your physician recommends that you have lab work: BMET   Your physician has requested that you have an echocardiogram. Echocardiography is a painless test that uses sound waves to create images of your heart. It provides your doctor with information about the size and shape of your heart and how well your heart's chambers and valves are working. This procedure takes approximately one hour. There are no restrictions for this procedure.   Your physician wants you to follow-up in: 6 month with Dr. Irish Lack. You will receive a reminder letter in the mail two months in advance. If you don't receive a letter, please call our office to schedule the follow-up appointment.

## 2014-09-23 LAB — BASIC METABOLIC PANEL
BUN: 20 mg/dL (ref 6–23)
CO2: 24 mEq/L (ref 19–32)
CREATININE: 0.8 mg/dL (ref 0.4–1.5)
Calcium: 9 mg/dL (ref 8.4–10.5)
Chloride: 105 mEq/L (ref 96–112)
GFR: 107.83 mL/min (ref >60.00)
GLUCOSE: 132 mg/dL — AB (ref 70–99)
Potassium: 4.2 mEq/L (ref 3.5–5.1)
Sodium: 135 mEq/L (ref 135–145)

## 2014-10-01 ENCOUNTER — Ambulatory Visit (HOSPITAL_COMMUNITY): Payer: Medicare Other | Attending: Interventional Cardiology | Admitting: Radiology

## 2014-10-01 DIAGNOSIS — I10 Essential (primary) hypertension: Secondary | ICD-10-CM | POA: Insufficient documentation

## 2014-10-01 DIAGNOSIS — I429 Cardiomyopathy, unspecified: Secondary | ICD-10-CM | POA: Diagnosis present

## 2014-10-01 DIAGNOSIS — I482 Chronic atrial fibrillation, unspecified: Secondary | ICD-10-CM

## 2014-10-01 DIAGNOSIS — I428 Other cardiomyopathies: Secondary | ICD-10-CM

## 2014-10-01 NOTE — Progress Notes (Signed)
Echocardiogram performed.  

## 2014-10-07 ENCOUNTER — Encounter: Payer: Self-pay | Admitting: *Deleted

## 2014-11-03 ENCOUNTER — Other Ambulatory Visit: Payer: Self-pay

## 2014-11-03 MED ORDER — LISINOPRIL 10 MG PO TABS: 15.0000 mg | ORAL_TABLET | Freq: Every day | ORAL | Status: DC

## 2014-11-03 MED ORDER — FUROSEMIDE 40 MG PO TABS: 40.0000 mg | ORAL_TABLET | Freq: Every day | ORAL | Status: DC

## 2014-11-03 MED ORDER — CARVEDILOL 3.125 MG PO TABS: 3.1250 mg | ORAL_TABLET | Freq: Two times a day (BID) | ORAL | Status: DC

## 2014-11-03 MED ORDER — POTASSIUM CHLORIDE ER 10 MEQ PO TBCR: 10.0000 meq | EXTENDED_RELEASE_TABLET | Freq: Every day | ORAL | Status: DC

## 2014-11-06 ENCOUNTER — Encounter: Payer: Self-pay | Admitting: Internal Medicine

## 2014-11-06 ENCOUNTER — Ambulatory Visit (INDEPENDENT_AMBULATORY_CARE_PROVIDER_SITE_OTHER): Payer: Medicare Other | Admitting: Internal Medicine

## 2014-11-06 VITALS — BP 108/54 | HR 70 | Ht 69.0 in | Wt 220.0 lb

## 2014-11-06 DIAGNOSIS — I2510 Atherosclerotic heart disease of native coronary artery without angina pectoris: Secondary | ICD-10-CM

## 2014-11-06 DIAGNOSIS — I10 Essential (primary) hypertension: Secondary | ICD-10-CM

## 2014-11-06 DIAGNOSIS — I482 Chronic atrial fibrillation, unspecified: Secondary | ICD-10-CM

## 2014-11-06 DIAGNOSIS — Z950 Presence of cardiac pacemaker: Secondary | ICD-10-CM

## 2014-11-06 DIAGNOSIS — I5022 Chronic systolic (congestive) heart failure: Secondary | ICD-10-CM

## 2014-11-06 LAB — MDC_IDC_ENUM_SESS_TYPE_INCLINIC
Brady Statistic RA Percent Paced: 0 %
Brady Statistic RV Percent Paced: 80 %
Implantable Pulse Generator Model: 2210
Implantable Pulse Generator Serial Number: 7441572
Lead Channel Impedance Value: 775 Ohm
Lead Channel Pacing Threshold Amplitude: 0.875 V
Lead Channel Pacing Threshold Amplitude: 1 V
Lead Channel Pacing Threshold Pulse Width: 0.5 ms
Lead Channel Pacing Threshold Pulse Width: 0.5 ms
Lead Channel Sensing Intrinsic Amplitude: 3.6 mV
MDC IDC MSMT BATTERY REMAINING LONGEVITY: 139.2 mo
MDC IDC MSMT BATTERY VOLTAGE: 2.98 V
MDC IDC MSMT LEADCHNL RA IMPEDANCE VALUE: 387.5 Ohm
MDC IDC MSMT LEADCHNL RV SENSING INTR AMPL: 7.8 mV
MDC IDC SESS DTM: 20160128171916
MDC IDC SET LEADCHNL RV PACING AMPLITUDE: 1.125
MDC IDC SET LEADCHNL RV PACING PULSEWIDTH: 0.5 ms
MDC IDC SET LEADCHNL RV SENSING SENSITIVITY: 2 mV

## 2014-11-06 NOTE — Patient Instructions (Signed)
Please call when you are ready to schedule the upgrade of your device.  Exercise as tolerated.  Your physician recommends that you continue on your current medications as directed. Please refer to the Current Medication list given to you today.

## 2014-11-07 NOTE — Assessment & Plan Note (Signed)
He is s/p PCI approx. 4 months ago. No anginal symptoms.

## 2014-11-07 NOTE — Progress Notes (Signed)
HPI Derek Odonnell returns today for followup. He is a very pleasant 69 year old man with symptomatic bradycardia and atrial fibrillation, due to complete heart block, status post permanent pacemaker insertion. He returns today to discuss the results of his echo in conjunction with pacing induced LBBB with a QRS duration of 170 ms. The patient admits to dyspnea with exertion and has become fairly sedentary. He denies chest pain, syncope, or peripheral edema. He has very mild weakness, especially when his blood pressure is running low. He has been on good medical therapy with a beta blocker and ACE inhibitor and blood pressure runs on the low side. He underwent coronary stenting approx.4 months ago.   Allergies  Allergen Reactions  . Spironolactone Diarrhea     Current Outpatient Prescriptions  Medication Sig Dispense Refill  . aspirin 325 MG buffered tablet Take 1 tablet (325 mg total) by mouth daily.    . carvedilol (COREG) 3.125 MG tablet Take 1 tablet (3.125 mg total) by mouth 2 (two) times daily with a meal. 60 tablet 5  . clopidogrel (PLAVIX) 75 MG tablet Take 75 mg by mouth daily.    . furosemide (LASIX) 40 MG tablet Take 1 tablet (40 mg total) by mouth daily. 30 tablet 5  . lisinopril (PRINIVIL,ZESTRIL) 10 MG tablet Take 1.5 tablets (15 mg total) by mouth daily. 45 tablet 3  . nitroGLYCERIN (NITROSTAT) 0.4 MG SL tablet Place 1 tablet (0.4 mg total) under the tongue every 5 (five) minutes as needed for chest pain. 25 tablet 12  . OVER THE COUNTER MEDICATION Take 1 packet by mouth daily. Vitamin packet    . potassium chloride (K-DUR) 10 MEQ tablet Take 1 tablet (10 mEq total) by mouth daily. 30 tablet 5   No current facility-administered medications for this visit.     Past Medical History  Diagnosis Date  . Hypertension   . Syncope     a. 10/2012 Echo: EF 60-65%, no reg wma, mild MR, mildly dil LA.  . CHB (complete heart block)     a. 10/2012 s/p SJM 2210 Acc[address] DR RF DC PPM, ser #  [id number].  Marland Kitchen A-fib   . Pacemaker   . Colon cancer metastasized to liver ~ 1988 - 1989    mets to the liver with a liver resection  . Shoulder impingement     "both; flare up q now and then" (05/09/2014)  . Chronic systolic heart failure   . Other primary cardiomyopathies   . Coronary artery disease   . Shortness of breath     " AT TIMES "    ROS:   All systems reviewed and negative except as noted in the HPI.   Past Surgical History  Procedure Laterality Date  . Liver resection  ~ 1989    Liver CA  . Insert / replace / remove pacemaker  11/05/2012  . Colon surgery  ~ 1988    "for CA"  . Refractive surgery Bilateral   . Coronary stent placement  06/18/2014    MID CIRC & RCA  . Cardiac catheterization  06/18/2014    DR Irish Lack  . Permanent pacemaker insertion N/A 11/05/2012    Procedure: PERMANENT PACEMAKER INSERTION;  Surgeon: Evans Lance, MD;  Location: [facility] CATH LAB;  Service: Cardiovascular;  Laterality: N/A;  . Left heart catheterization with coronary angiogram N/A 05/15/2014    Procedure: LEFT HEART CATHETERIZATION WITH CORONARY ANGIOGRAM;  Surgeon: Jettie Booze, MD;  Location: [facility] CATH LAB;  Service: Cardiovascular;  Laterality: N/A;  . Percutaneous coronary stent intervention (pci-s) N/A 06/18/2014    Procedure: PERCUTANEOUS CORONARY STENT INTERVENTION (PCI-S);  Surgeon: Jettie Booze, MD;  Location: [facility] CATH LAB;  Service: Cardiovascular;  Laterality: N/A;  3.0/20 Promus DEstent to distal RCA  . Cardiac catheterization  06/18/2014    Procedure: INTRAVASCULAR PRESSURE WIRE/FFR STUDY;  Surgeon: Jettie Booze, MD;  Location: [facility] CATH LAB;  Service: Cardiovascular;;  MId LAD  . Percutaneous coronary intervention-balloon only  06/18/2014    Procedure: PERCUTANEOUS CORONARY INTERVENTION-BALLOON ONLY;  Surgeon: Jettie Booze, MD;  Location: [facility] CATH LAB;  Service: Cardiovascular;;  CTO Mid CFX     Family History  Problem Relation Age of Onset  . Stroke Mother      64s  . Stroke Father 32     History   Social History  . Marital Status: Married    Spouse Name: N/A    Number of Children: N/A  . Years of Education: N/A   Occupational History  . Not on file.   Social History Main Topics  . Smoking status: Light Tobacco Smoker -- 53 years    Types: Cigars  . Smokeless tobacco: Current User    Types: Chew     Comment: 05/09/2014 "I use chew q now and then"  . Alcohol Use: Yes     Comment: 05/09/2014 "I've had 2 beers in the last 6 months"  . Drug Use: No  . Sexual Activity: No   Other Topics Concern  . Not on file   Social History Narrative     BP 108/54 mmHg  Pulse 70  Ht 5\' 9"  (1.753 m)  Wt 220 lb (99.791 kg)  BMI 32.47 kg/m2  Physical Exam:  Well appearing 69 year old man, NAD HEENT: Unremarkable Neck:  7 cm JVD, no thyromegally Lungs:  Clear with no wheezes, rales, or rhonchi HEART:  IRegular rate rhythm, no murmurs, no rubs, no clicks Abd:  soft, positive bowel sounds, no organomegally, no rebound, no guarding Ext:  2 plus pulses, no edema, no cyanosis, no clubbing Skin:  No rashes no nodules Neuro:  CN II through XII intact, motor grossly intact   DEVICE  Normal device function.  See PaceArt for details.   Assess/Plan:

## 2014-11-07 NOTE — Assessment & Plan Note (Signed)
He is 100% in atrial fib and his ventricular rate is well controlled with underlying CHB. Will follow.

## 2014-11-07 NOTE — Assessment & Plan Note (Signed)
His blood pressure is on the low side now. No change in meds. He will continue a low sodium diet.

## 2014-11-07 NOTE — Assessment & Plan Note (Signed)
His St. Jude DDD PM is working normally. Will recheck in several months. 

## 2014-11-07 NOTE — Assessment & Plan Note (Signed)
He has chronic systolic heart failure and has class 3A symptoms. I offered the patient upgrade to a BiV ICD in the setting of pacing induced LBBB. He is considering and will call us if he would like to proceed. He is on maximal medical therapy with uptitration limited by hypotension.

## 2014-11-17 ENCOUNTER — Telehealth: Payer: Self-pay | Admitting: *Deleted

## 2014-11-17 NOTE — Telephone Encounter (Signed)
LMOVM w/ my direct #.  Re: turn off RV autocap due to high output mode.

## 2014-11-19 NOTE — Telephone Encounter (Signed)
Appt made w/ device clinic 11/20/14.

## 2014-11-20 ENCOUNTER — Ambulatory Visit (INDEPENDENT_AMBULATORY_CARE_PROVIDER_SITE_OTHER): Payer: Medicare Other | Admitting: *Deleted

## 2014-11-20 DIAGNOSIS — I442 Atrioventricular block, complete: Secondary | ICD-10-CM

## 2014-11-20 LAB — MDC_IDC_ENUM_SESS_TYPE_INCLINIC
Brady Statistic RA Percent Paced: 0 %
Brady Statistic RV Percent Paced: 38 %
Implantable Pulse Generator Serial Number: 7441572
Lead Channel Impedance Value: 700 Ohm
Lead Channel Pacing Threshold Amplitude: 0.75 V
Lead Channel Pacing Threshold Pulse Width: 0.5 ms
Lead Channel Sensing Intrinsic Amplitude: 5 mV
Lead Channel Sensing Intrinsic Amplitude: 6.1 mV
Lead Channel Setting Pacing Amplitude: 2.5 V
Lead Channel Setting Sensing Sensitivity: 2 mV
MDC IDC MSMT BATTERY REMAINING LONGEVITY: 118.8 mo
MDC IDC MSMT BATTERY VOLTAGE: 3.01 V
MDC IDC MSMT LEADCHNL RV PACING THRESHOLD AMPLITUDE: 0.75 V
MDC IDC MSMT LEADCHNL RV PACING THRESHOLD PULSEWIDTH: 0.5 ms
MDC IDC SESS DTM: 20160211145015
MDC IDC SET LEADCHNL RV PACING PULSEWIDTH: 0.5 ms

## 2014-11-20 NOTE — Progress Notes (Signed)
Pacemaker check in clinic. Normal device function. Thresholds, sensing, impedances consistent with previous measurements. Device programmed to maximize longevity. 2 high ventricular rates noted. Device programmed at appropriate safety margins. Histogram distribution appropriate for patient activity level. Autocapture programmed off today.  Device programmed to optimize intrinsic conduction. Estimated longevity 9.9 years. Patient enrolled in remote follow-up/TTM's with [employer]. Plan to follow every 3 months remotely and see annually in office. Patient education completed.  Follow up as scheduled with Merlin 4/848/16.

## 2014-11-27 ENCOUNTER — Encounter: Payer: Self-pay | Admitting: Internal Medicine

## 2014-12-15 ENCOUNTER — Telehealth: Payer: Self-pay | Admitting: Interventional Cardiology

## 2014-12-15 ENCOUNTER — Encounter: Payer: Self-pay | Admitting: *Deleted

## 2014-12-15 NOTE — Telephone Encounter (Signed)
I called and spoke with the patient for Dr. Irish Lack- he states he has a "[employer] Up" that he gets on to help loosen his back. He is completed inverted for about 1-2 minutes. He was concerned about the effects on his heart that this might have. He states he has not done this for about 3-4 years and was wanting to know if it was ok with Dr. Livingston Diones. I advised I would review with him and call him back. He used it the other day for the first time and his daughter instructed him to call and find out if this was ok.  In regards to Dr. Lovena Le, he states that Dr. Lovena Le wants to up grade his device. He stated he was trying to exercise some more and wanted to give this about 2 months to see if this would help his heart get stronger. He states that Dr. Lovena Le said if he waited the 2 months he would "have to start all over." He was a little unclear about it he is ready to schedule this. He still would like for Dr. Tanna Furry nurse to call him. I will forward the message to her.

## 2014-12-15 NOTE — Telephone Encounter (Signed)
Routed to both Alvis Lemmings, RN, and Janan Halter, RN.

## 2014-12-15 NOTE — Telephone Encounter (Signed)
Chronic systolic heart failure - Evans Lance, MD at 11/07/2014 8:08 AM     Status: Written Related Problem: Chronic systolic heart failure   Expand All Collapse All   He has chronic systolic heart failure and has class 3A symptoms. I offered the patient upgrade to a BiV ICD in the setting of pacing induced LBBB. He is considering and will call us if he would like to proceed. He is on maximal medical therapy with uptitration limited by hypotension

## 2014-12-15 NOTE — Telephone Encounter (Signed)
I am ok with him using the [employer] Up

## 2014-12-15 NOTE — Telephone Encounter (Signed)
New Msg    Pt requesting msgs be routed to two different nurses....    1. For Dr. Hassell Done nurse.... Pt has questions about a procedure he wants to have completed on his back, non-surgical. Please call to discuss.    2. For Dr. Tanna Furry nurse.Marland KitchenMarland KitchenPt calling to discuss a pacemaker procedure Dr. Lovena Le suggested to him. Please call to discuss.

## 2014-12-16 NOTE — Telephone Encounter (Signed)
The patient is aware he can use the [employer] up.   Will forward to [facility] for Dr. Lovena Le.

## 2014-12-18 NOTE — Telephone Encounter (Signed)
Spoke with patient and let him know he can call when he is ready to proceed.  He will call back when he is ready to proceed

## 2015-02-05 ENCOUNTER — Ambulatory Visit (INDEPENDENT_AMBULATORY_CARE_PROVIDER_SITE_OTHER): Payer: Medicare Other | Admitting: *Deleted

## 2015-02-05 DIAGNOSIS — I442 Atrioventricular block, complete: Secondary | ICD-10-CM | POA: Diagnosis not present

## 2015-02-05 NOTE — Progress Notes (Signed)
Remote pacemaker transmission.   

## 2015-02-13 ENCOUNTER — Encounter: Payer: Self-pay | Admitting: Internal Medicine

## 2015-02-13 LAB — CUP PACEART REMOTE DEVICE CHECK
Battery Remaining Longevity: 139 mo
Battery Voltage: 3.01 V
Brady Statistic RV Percent Paced: 44 %
Date Time Interrogation Session: 20160428063944
Lead Channel Impedance Value: 740 Ohm
Lead Channel Pacing Threshold Amplitude: 0.75 V
Lead Channel Sensing Intrinsic Amplitude: 7.2 mV
Lead Channel Setting Pacing Pulse Width: 0.5 ms
MDC IDC MSMT BATTERY REMAINING PERCENTAGE: 95.5 %
MDC IDC MSMT LEADCHNL RV PACING THRESHOLD PULSEWIDTH: 0.5 ms
MDC IDC SET LEADCHNL RV PACING AMPLITUDE: 2.5 V
MDC IDC SET LEADCHNL RV SENSING SENSITIVITY: 2 mV
Pulse Gen Serial Number: 7441572

## 2015-02-19 ENCOUNTER — Encounter: Payer: Self-pay | Admitting: Cardiology

## 2015-02-24 ENCOUNTER — Encounter: Payer: Self-pay | Admitting: Internal Medicine

## 2015-04-03 ENCOUNTER — Encounter: Payer: Self-pay | Admitting: Interventional Cardiology

## 2015-04-03 ENCOUNTER — Ambulatory Visit (INDEPENDENT_AMBULATORY_CARE_PROVIDER_SITE_OTHER): Payer: Medicare Other | Admitting: Interventional Cardiology

## 2015-04-03 VITALS — BP 104/70 | HR 70 | Ht 69.0 in | Wt 217.8 lb

## 2015-04-03 DIAGNOSIS — I10 Essential (primary) hypertension: Secondary | ICD-10-CM | POA: Diagnosis not present

## 2015-04-03 DIAGNOSIS — I482 Chronic atrial fibrillation, unspecified: Secondary | ICD-10-CM

## 2015-04-03 DIAGNOSIS — R931 Abnormal findings on diagnostic imaging of heart and coronary circulation: Secondary | ICD-10-CM

## 2015-04-03 DIAGNOSIS — I25810 Atherosclerosis of coronary artery bypass graft(s) without angina pectoris: Secondary | ICD-10-CM | POA: Diagnosis not present

## 2015-04-03 NOTE — Patient Instructions (Signed)
Medication Instructions:  Same-no change  Labwork: In 3 months. Please do not eat or drink after midnight the night before labs are drawn.  Testing/Procedures: None  Follow-Up: Your physician wants you to follow-up in: 6 months. You will receive a reminder letter in the mail two months in advance. If you don't receive a letter, please call our office to schedule the follow-up appointment.

## 2015-04-03 NOTE — Progress Notes (Signed)
Patient ID: Derek Odonnell, male   DOB: 1946/06/07, 69 y.o.   MRN: [id number]     Cardiology Office Note   Date:  04/06/2015   ID:  Derek, Odonnell 1946-02-08, MRN [id number]  PCP:  No PCP Per Patient    No chief complaint on file.  Follow-up cardiomyopathy  Wt Readings from Last 3 Encounters:  04/03/15 217 lb 12.8 oz (98.793 kg)  11/06/14 220 lb (99.791 kg)  09/22/14 215 lb 12.8 oz (97.886 kg)       History of Present Illness: Derek Odonnell is a 69 y.o. male  who has a pacer. He had a nonischemic cardiomyopathy. He denies chest pain. He has played some golf. He has not been exercising. He has gained weight.  He was started on Spirnolactone to help with his left ventricular dysfunction. He had significant diarrhea with this medication. He stopped the medication.   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   He is limited by joint pains. He has not had significant shortness of breath or chest pain. He does not feel palpitations. He has seen Dr. Lovena Le. Anticoagulation was discussed there.  He had other medical questions not related to himself such as why athletes get DVT.  Past Medical History  Diagnosis Date  . Hypertension   . Syncope     a. 10/2012 Echo: EF 60-65%, no reg wma, mild MR, mildly dil LA.  . CHB (complete heart block)     a. 10/2012 s/p SJM 2210 [address] DR RF DC PPM, ser # [id number].  Marland Kitchen A-fib   . Pacemaker   . Colon cancer metastasized to liver ~ 1988 - 1989    mets to the liver with a liver resection  . Shoulder impingement     "both; flare up q now and then" (05/09/2014)  . Chronic systolic heart failure   . Other primary cardiomyopathies   . Coronary artery disease   . Shortness of breath     " AT TIMES "    Past Surgical History  Procedure Laterality Date  . Liver resection  ~ 1989    Liver CA  . Insert / replace / remove pacemaker  11/05/2012  . Colon surgery  ~ 1988    "for CA"  . Refractive surgery  Bilateral   . Coronary stent placement  06/18/2014    MID CIRC & RCA  . Cardiac catheterization  06/18/2014    DR Irish Lack  . Permanent pacemaker insertion N/A 11/05/2012    Procedure: PERMANENT PACEMAKER INSERTION;  Surgeon: Evans Lance, MD;  Location: [facility] CATH LAB;  Service: Cardiovascular;  Laterality: N/A;  . Left heart catheterization with coronary angiogram N/A 05/15/2014    Procedure: LEFT HEART CATHETERIZATION WITH CORONARY ANGIOGRAM;  Surgeon: Jettie Booze, MD;  Location: [facility] CATH LAB;  Service: Cardiovascular;  Laterality: N/A;  . Percutaneous coronary stent intervention (pci-s) N/A 06/18/2014    Procedure: PERCUTANEOUS CORONARY STENT INTERVENTION (PCI-S);  Surgeon: Jettie Booze, MD;  Location: [facility] CATH LAB;  Service: Cardiovascular;  Laterality: N/A;  3.0/20 Promus DEstent to distal RCA  . Cardiac catheterization  06/18/2014    Procedure: INTRAVASCULAR PRESSURE WIRE/FFR STUDY;  Surgeon: Jettie Booze, MD;  Location: [facility] CATH LAB;  Service: Cardiovascular;;  MId LAD  . Percutaneous coronary intervention-balloon only  06/18/2014    Procedure: PERCUTANEOUS CORONARY INTERVENTION-BALLOON ONLY;  Surgeon: Jettie Booze, MD;  Location: [facility] CATH LAB;  Service:  Cardiovascular;;  CTO Mid CFX     Current Outpatient Prescriptions  Medication Sig Dispense Refill  . aspirin 325 MG buffered tablet Take 1 tablet (325 mg total) by mouth daily.    . carvedilol (COREG) 3.125 MG tablet Take 1 tablet (3.125 mg total) by mouth 2 (two) times daily with a meal. 60 tablet 5  . clopidogrel (PLAVIX) 75 MG tablet Take 75 mg by mouth daily.    . furosemide (LASIX) 40 MG tablet Take 1 tablet (40 mg total) by mouth daily. 30 tablet 5  . lisinopril (PRINIVIL,ZESTRIL) 10 MG tablet Take 1.5 tablets (15 mg total) by mouth daily. 45 tablet 3  . nitroGLYCERIN (NITROSTAT) 0.4 MG SL tablet Place 1 tablet (0.4 mg total) under the tongue every 5 (five) minutes as needed for chest pain. 25 tablet 12  . OVER  THE COUNTER MEDICATION Take 1 packet by mouth daily. Vitamin packet    . potassium chloride (K-DUR) 10 MEQ tablet Take 1 tablet (10 mEq total) by mouth daily. 30 tablet 5   No current facility-administered medications for this visit.    Allergies:   Spironolactone    Social History:  The patient  reports that he has been smoking Cigars.  He quit smokeless tobacco use about 8 months ago. His smokeless tobacco use included Chew. He reports that he drinks alcohol. He reports that he does not use illicit drugs.   Family History:  The patient's family history includes Stroke in his mother; Stroke (age of onset: 46) in his father.    ROS:  Please see the history of present illness.   Otherwise, review of systems are positive for joint pains.   All other systems are reviewed and negative.    PHYSICAL EXAM: VS:  BP 104/70 mmHg  Pulse 70  Ht _0  (1.753 m)  Wt 217 lb 12.8 oz (98.793 kg)  BMI 32.15 kg/m2 , BMI Body mass index is 32.15 kg/(m^2). GEN: Well nourished, well developed, in no acute distress HEENT: normal Neck: no JVD, carotid bruits, or masses Cardiac: irregularly irregular; no murmurs, rubs, or gallops,no edema  Respiratory:  clear to auscultation bilaterally, normal work of breathing GI: soft, nontender, nondistended, + BS MS: no deformity or atrophy Skin: warm and dry, no rash Neuro:  Strength and sensation are intact Psych: euthymic mood, full affect   EKG:   The ekg ordered today demonstrates AFib, paced rhythm   Recent Labs: 05/08/2014: Pro B Natriuretic peptide (BNP) 2923.0* 05/09/2014: ALT 22 06/19/2014: Hemoglobin 13.6; Platelets 108* 07/15/2014: TSH 0.94 09/22/2014: BUN 20; Creatinine, Ser 0.8; Potassium 4.2; Sodium 135   Lipid Panel No results found for: CHOL, TRIG, HDL, CHOLHDL, VLDL, LDLCALC, LDLDIRECT   Other studies Reviewed: Additional studies/ records that were reviewed today with results demonstrating: cath with balloon angioplasty to the circumflex  system and drug-eluting stent placement to the RCA. LAD lesion was not significant by FFR.   ASSESSMENT AND PLAN:  1. Atrial fibrillation: We discussed the fact that he would benefit from anticoagulation in terms of stroke prevention. He at this time prefers to stay on the aspirin and Plavix. I explained him that we do not have evidence that that is sufficient for stroke prevention in the setting of atrial fibrillation. He will consider trying Xarelto or Eliquis. I would likely stop the antiplatelets therapy after a year after the drug-eluting stent if he is taking anticoagulation. In the interim, would likely continue Plavix and one of the anticoagulate medicines. Coumadin was also  described to him. He is not in favor of having to come in for monitoring. 2. Coronary artery disease: No angina. Moderate LAD disease remaining. Follow for symptoms. 3. Cardiomyopathy/decreased EF: Continue ACE inhibitor and beta blocker. He did not tolerate aldosteronism blockade. 4. Hypertension: Blood pressure well controlled.   Current medicines are reviewed at length with the patient today.  The patient concerns regarding his medicines were addressed.  The following changes have been made:  No change  Labs/ tests ordered today include:   Orders Placed This Encounter  Procedures  . Comp Met (CMET)  . Lipid Profile  . CBC with Differential  . EKG 12-Lead    Recommend 150 minutes/week of aerobic exercise Low fat, low carb, high fiber diet recommended  Disposition:   FU in  6 months   Teresita Madura., MD  04/06/2015 4:26 PM    [facility] [address], [place], [place]  [address] Phone: [phone]; Fax: [phone]

## 2015-04-06 ENCOUNTER — Encounter: Payer: Self-pay | Admitting: Interventional Cardiology

## 2015-04-06 DIAGNOSIS — R931 Abnormal findings on diagnostic imaging of heart and coronary circulation: Secondary | ICD-10-CM | POA: Insufficient documentation

## 2015-04-29 ENCOUNTER — Other Ambulatory Visit: Payer: Self-pay | Admitting: *Deleted

## 2015-04-29 MED ORDER — POTASSIUM CHLORIDE ER 10 MEQ PO TBCR: 10.0000 meq | EXTENDED_RELEASE_TABLET | Freq: Every day | ORAL | Status: AC

## 2015-04-29 MED ORDER — FUROSEMIDE 40 MG PO TABS: 40.0000 mg | ORAL_TABLET | Freq: Every day | ORAL | Status: AC

## 2015-04-29 MED ORDER — CLOPIDOGREL BISULFATE 75 MG PO TABS: 75.0000 mg | ORAL_TABLET | Freq: Every day | ORAL | Status: AC

## 2015-04-29 MED ORDER — LISINOPRIL 10 MG PO TABS: 15.0000 mg | ORAL_TABLET | Freq: Every day | ORAL | Status: AC

## 2015-04-29 MED ORDER — CARVEDILOL 3.125 MG PO TABS: 3.1250 mg | ORAL_TABLET | Freq: Two times a day (BID) | ORAL | Status: AC

## 2015-05-11 ENCOUNTER — Ambulatory Visit (INDEPENDENT_AMBULATORY_CARE_PROVIDER_SITE_OTHER): Payer: Medicare Other | Admitting: *Deleted

## 2015-05-11 DIAGNOSIS — I442 Atrioventricular block, complete: Secondary | ICD-10-CM | POA: Diagnosis not present

## 2015-05-13 NOTE — Progress Notes (Signed)
Remote pacemaker transmission.   

## 2015-05-23 LAB — CUP PACEART REMOTE DEVICE CHECK
Battery Remaining Percentage: 95.5 %
Battery Voltage: 3.01 V
Date Time Interrogation Session: 20160801070940
Lead Channel Impedance Value: 780 Ohm
Lead Channel Pacing Threshold Amplitude: 0.75 V
Lead Channel Pacing Threshold Pulse Width: 0.5 ms
MDC IDC MSMT BATTERY REMAINING LONGEVITY: 139 mo
MDC IDC MSMT LEADCHNL RV SENSING INTR AMPL: 8.4 mV
MDC IDC SET LEADCHNL RV PACING AMPLITUDE: 2.5 V
MDC IDC SET LEADCHNL RV PACING PULSEWIDTH: 0.5 ms
MDC IDC SET LEADCHNL RV SENSING SENSITIVITY: 2 mV
MDC IDC STAT BRADY RV PERCENT PACED: 36 %
Pulse Gen Model: 2210
Pulse Gen Serial Number: 7441572

## 2015-06-11 ENCOUNTER — Encounter: Payer: Self-pay | Admitting: Cardiology

## 2015-06-22 ENCOUNTER — Other Ambulatory Visit (INDEPENDENT_AMBULATORY_CARE_PROVIDER_SITE_OTHER): Payer: Medicare Other | Admitting: *Deleted

## 2015-06-22 DIAGNOSIS — I25810 Atherosclerosis of coronary artery bypass graft(s) without angina pectoris: Secondary | ICD-10-CM

## 2015-06-22 LAB — CBC WITH DIFFERENTIAL/PLATELET
BASOS ABS: 0 10*3/uL (ref 0.0–0.1)
Basophils Relative: 0.5 % (ref 0.0–3.0)
Eosinophils Absolute: 0.4 10*3/uL (ref 0.0–0.7)
Eosinophils Relative: 5.3 % — ABNORMAL HIGH (ref 0.0–5.0)
HCT: 48 % (ref 39.0–52.0)
Hemoglobin: 16 g/dL (ref 13.0–17.0)
LYMPHS ABS: 1.4 10*3/uL (ref 0.7–4.0)
Lymphocytes Relative: 20.6 % (ref 12.0–46.0)
MCHC: 33.4 g/dL (ref 30.0–36.0)
MCV: 95.3 fl (ref 78.0–100.0)
MONO ABS: 0.6 10*3/uL (ref 0.1–1.0)
Monocytes Relative: 9.2 % (ref 3.0–12.0)
NEUTROS PCT: 64.4 % (ref 43.0–77.0)
Neutro Abs: 4.2 10*3/uL (ref 1.4–7.7)
Platelets: 133 10*3/uL — ABNORMAL LOW (ref 150.0–400.0)
RBC: 5.03 Mil/uL (ref 4.22–5.81)
RDW: 14.4 % (ref 11.5–15.5)
WBC: 6.6 10*3/uL (ref 4.0–10.5)

## 2015-06-22 LAB — COMPREHENSIVE METABOLIC PANEL
ALT: 16 U/L (ref 0–53)
AST: 29 U/L (ref 0–37)
Albumin: 3.6 g/dL (ref 3.5–5.2)
Alkaline Phosphatase: 47 U/L (ref 39–117)
BILIRUBIN TOTAL: 0.7 mg/dL (ref 0.2–1.2)
BUN: 18 mg/dL (ref 6–23)
CO2: 18 meq/L — AB (ref 19–32)
Calcium: 8.6 mg/dL (ref 8.4–10.5)
Chloride: 103 mEq/L (ref 96–112)
Creatinine, Ser: 0.92 mg/dL (ref 0.40–1.50)
GFR: 86.31 mL/min (ref >60.00)
Glucose, Bld: 103 mg/dL — ABNORMAL HIGH (ref 70–99)
POTASSIUM: 4.8 meq/L (ref 3.5–5.1)
SODIUM: 133 meq/L — AB (ref 135–145)
TOTAL PROTEIN: 7.1 g/dL (ref 6.0–8.3)

## 2015-06-22 LAB — LIPID PANEL
Cholesterol: 157 mg/dL (ref 0–200)
HDL: 32.8 mg/dL — AB (ref >39.00)
LDL Cholesterol: 100 mg/dL — ABNORMAL HIGH (ref 0–99)
NONHDL: 124.14
Total CHOL/HDL Ratio: 5
Triglycerides: 120 mg/dL (ref 0.0–149.0)
VLDL: 24 mg/dL (ref 0.0–40.0)

## 2015-06-22 NOTE — Addendum Note (Signed)
Addended by: Eulis Foster on: 06/22/2015 08:19 AM   Modules accepted: Orders

## 2015-06-23 ENCOUNTER — Encounter: Payer: Self-pay | Admitting: Internal Medicine

## 2015-07-11 ENCOUNTER — Encounter (HOSPITAL_COMMUNITY): Payer: Self-pay | Admitting: Emergency Medicine

## 2015-07-11 ENCOUNTER — Emergency Department (HOSPITAL_COMMUNITY): Payer: Medicare Other

## 2015-07-11 ENCOUNTER — Emergency Department (HOSPITAL_COMMUNITY)
Admission: EM | Admit: 2015-07-11 | Discharge: 2015-07-11 | Disposition: A | Discharge status: Home / Self Care | Payer: Medicare Other | Attending: Emergency Medicine | Admitting: Emergency Medicine

## 2015-07-11 DIAGNOSIS — Z79899 Other long term (current) drug therapy: Secondary | ICD-10-CM | POA: Insufficient documentation

## 2015-07-11 DIAGNOSIS — Z950 Presence of cardiac pacemaker: Secondary | ICD-10-CM | POA: Diagnosis not present

## 2015-07-11 DIAGNOSIS — Z85038 Personal history of other malignant neoplasm of large intestine: Secondary | ICD-10-CM | POA: Diagnosis not present

## 2015-07-11 DIAGNOSIS — I5022 Chronic systolic (congestive) heart failure: Secondary | ICD-10-CM | POA: Diagnosis not present

## 2015-07-11 DIAGNOSIS — R2243 Localized swelling, mass and lump, lower limb, bilateral: Secondary | ICD-10-CM | POA: Insufficient documentation

## 2015-07-11 DIAGNOSIS — Z720 Tobacco use: Secondary | ICD-10-CM | POA: Insufficient documentation

## 2015-07-11 DIAGNOSIS — I10 Essential (primary) hypertension: Secondary | ICD-10-CM | POA: Diagnosis not present

## 2015-07-11 DIAGNOSIS — Z7902 Long term (current) use of antithrombotics/antiplatelets: Secondary | ICD-10-CM | POA: Diagnosis not present

## 2015-07-11 DIAGNOSIS — R0602 Shortness of breath: Secondary | ICD-10-CM | POA: Diagnosis present

## 2015-07-11 DIAGNOSIS — Z7982 Long term (current) use of aspirin: Secondary | ICD-10-CM | POA: Insufficient documentation

## 2015-07-11 DIAGNOSIS — M25473 Effusion, unspecified ankle: Secondary | ICD-10-CM

## 2015-07-11 DIAGNOSIS — I2510 Atherosclerotic heart disease of native coronary artery without angina pectoris: Secondary | ICD-10-CM | POA: Insufficient documentation

## 2015-07-11 HISTORY — DX: Heart failure, unspecified: I50.9

## 2015-07-11 LAB — CBC
HEMATOCRIT: 47.1 % (ref 39.0–52.0)
Hemoglobin: 15.9 g/dL (ref 13.0–17.0)
MCH: 31.9 pg (ref 26.0–34.0)
MCHC: 33.8 g/dL (ref 30.0–36.0)
MCV: 94.4 fL (ref 78.0–100.0)
PLATELETS: 120 10*3/uL — AB (ref 150–400)
RBC: 4.99 MIL/uL (ref 4.22–5.81)
RDW: 13.7 % (ref 11.5–15.5)
WBC: 6 10*3/uL (ref 4.0–10.5)

## 2015-07-11 LAB — I-STAT TROPONIN, ED: Troponin i, poc: 0.03 ng/mL (ref 0.00–0.08)

## 2015-07-11 LAB — BASIC METABOLIC PANEL
Anion gap: 11 (ref 5–15)
BUN: 20 mg/dL (ref 6–20)
CO2: 21 mmol/L — ABNORMAL LOW (ref 22–32)
CREATININE: 0.98 mg/dL (ref 0.61–1.24)
Calcium: 8.7 mg/dL — ABNORMAL LOW (ref 8.9–10.3)
Chloride: 103 mmol/L (ref 101–111)
GFR calc Af Amer: >60 mL/min (ref >60)
GLUCOSE: 117 mg/dL — AB (ref 65–99)
POTASSIUM: 4.1 mmol/L (ref 3.5–5.1)
SODIUM: 135 mmol/L (ref 135–145)

## 2015-07-11 MED ORDER — MELATONIN 1 MG PO TABS: 1.0000 mg | ORAL_TABLET | Freq: Every evening | ORAL | Status: AC

## 2015-07-11 NOTE — Discharge Instructions (Signed)
You have come to the ED for ankle swelling and insomnia. You are you take double your regular dose of lasix (80mg  total) daily for the next two days to alleviate fluid build-up. Recommended limiting day time sleeping and to take melatonin nightly for insomnia.   Return to ED should symptoms worsen.  Followup with PCP as needed.  Resource guide included to assist with choosing a PCP.   Emergency Department Resource Guide 1) Find a Doctor and Pay Out of Pocket Although you won't have to find out who is covered by your insurance plan, it is a good idea to ask around and get recommendations. You will then need to call the office and see if the doctor you have chosen will accept you as a new patient and what types of options they offer for patients who are self-pay. Some doctors offer discounts or will set up payment plans for their patients who do not have insurance, but you will need to ask so you aren't surprised when you get to your appointment.  2) Contact Your Local Health Department Not all health departments have doctors that can see patients for sick visits, but many do, so it is worth a call to see if yours does. If you don't know where your local health department is, you can check in your phone book. The CDC also has a tool to help you locate your state's health department, and many state websites also have listings of all of their local health departments.  3) Find a [facility] If your illness is not likely to be very severe or complicated, you may want to try a walk in clinic. These are popping up all over the country in pharmacies, drugstores, and shopping centers. They're usually staffed by nurse practitioners or physician assistants that have been trained to treat common illnesses and complaints. They're usually fairly quick and inexpensive. However, if you have serious medical issues or chronic medical problems, these are probably not your best option.  No Primary Care  Doctor: - Call Health Connect at  [phone] - they can help you locate a primary care doctor that  accepts your insurance, provides certain services, etc. - Physician Referral Service- [phone]  Chronic Pain Problems: Organization         Address  Phone   Notes  [facility]  [phone] Patients need to be referred by their primary care doctor.   Medication Assistance: Organization         Address  Phone   Notes  [facility] Medication [facility] [address] [place], [place] [address] [phone] --Must be a resident of [facility] -- Must have NO insurance coverage whatsoever (no Medicaid/ Medicare, etc.) -- The pt. MUST have a primary care doctor that directs their care regularly and follows them in the community   Med[facility]ist  [phone]   [employer]  [phone]    Agencies that provide inexpensive medical care: Organization         Address  Phone   Notes  [place]  [phone]   [facility]    [phone]   [facility] [place], [place] [address] [phone]   [place] [phone]   Planned Parenthood    [phone]   [facility]    [phone]   [facility]  [place] [address], [place] Phone:  [phone], Fax:  [phone] Hours of Operation:  9 am - 6 pm, M-F.  Also accepts Medicaid/Medicare and self-pay.  [facility] for [place] [address], [address], [place] Phone: [phone], Fax: [phone]. Hours of Operation:  8:30 am - 5:30 pm, M-F.  Also accepts Medicaid and self-pay.  [facility] [place] [address], [place] Phone: [phone]   [place], [place], [place] [phone], Ext. [phone] Mondays & Thursdays: 7-9 AM.  First 15 patients are seen on a first  come, first serve basis.    [place] Providers:  Organization         Address  Phone   Notes  [facility] [address], [place] [phone] Also accepts self-pay patients.  [facility] [address], [place]  [phone]   [place], [address], [place] [phone]   [facility] [address], [place] [phone]   Lucianne Lei [address], [place]   [phone] Only accepts [place] Access [place] patients after they have their name applied to their card.   Self-Pay (no insurance) in [facility]:  Organization         Address  Phone   Notes  Sickle Cell Patients, [facility] [place] [phone]   [facility] [place] [phone]   [facility] [place]  [place], [place],  [phone]   [facility]/Dr. Osei-Bonsu  [address] [place] or [address], [place] [phone] Phone number for both [place] and [place] locations is the same.  Urgent Medical and [facility] [address], [place] [phone]   [facility] [address] [place] or [address] [phone]   [facility] [address] [place] [phone], phone; [phone], fax Sees patients 1st and 3rd Saturday of every month.  Must not qualify for public or private insurance (i.e. Medicaid, Medicare, [place] Health Choice, Veterans' Benefits)  Household income should be no more than 200% of the poverty level The clinic cannot treat you if you are pregnant or think you are pregnant  Sexually transmitted diseases are not treated at the clinic.    Dental Care: Organization          Address  Phone  Notes  [facility] Department of [facility] [place] [phone] Accepts children up to age 5 who are enrolled in [place] or [place]; pregnant women with a Medicaid card; and children who have applied for Medicaid or [place] Health Choice, but were declined, whose parents can pay a reduced fee at time of service.  [facility] Department of [facility]  [address], [place] [phone] Accepts children up to age 14 who are enrolled in [place] or [place]; pregnant women with a Medicaid card; and children who have applied for Medicaid or  Health Choice, but were declined, whose parents can pay a reduced fee at time of service.  [facility] Adult Dental Access PROGRAM  [place], [place] [phone]  Patients are seen by appointment only. Walk-ins are not accepted. [place] will see patients 62 years of age and older. Monday - Tuesday (8am-5pm) Most Wednesdays (8:30-5pm) $30 per visit, cash only  [facility] Adult Dental Access PROGRAM  [address], [facility] [phone] Patients are seen by appointment only. Walk-ins are not accepted. [place] will see patients 76 years of age and older. One Wednesday Evening (Monthly: Volunteer Based).  $30 per visit, cash only  [place]  [phone] for adults; Children under age 48, call Graduate Pediatric Dentistry at [phone]. Children aged 49-14, please call [phone] to request a pediatric application.  Dental services are provided in all areas of dental care including fillings, crowns and bridges, complete and partial dentures, implants, gum treatment, root canals, and extractions. Preventive care is also provided. Treatment is provided to both adults and children. Patients are selected via a lottery and there is often a waiting list.   [facility] [address] [place]  [phone] [email or web address]   [facility] [address] [place], [place] [phone], Ext. [phone] Second and Fourth Thursday of each month, opens at 6:30 AM; Clinic ends at 9 AM.  Patients are seen on a first-come first-served basis, and a limited number are seen during each clinic.   [facility]  [address] Hillard Danker [place], [place] [phone]   Eligibility Requirements You must have lived in [place], [place], or [place] counties for at least the last three months.   You cannot be eligible for state or federal sponsored [employer], including [employer], [place], or [employer].   You generally cannot be eligible for healthcare insurance through your employer.    How to apply: Eligibility screenings are held every Tuesday and Wednesday afternoon from 1:00 pm until 4:00 pm. You do not need an appointment for the interview!  [facility] [address] [place], [place]   [place]  [place] Department  [place]  [phone]    Behavioral Health Resources in the Community: Intensive Outpatient Programs Organization         Address  Phone  Notes  [place]. [address] [place], [place] [phone]   [facility] [place] [facility] Outpatient [address], [place], [place]   ADS: Alcohol & Drug Svcs [address] [place], [place]   [place] [address],  [place], [place] or [phone]   Substance Abuse Resources Organization         Address  Phone  Notes  Alcohol and Drug Services  [phone]   [place]  [phone]   [facility] [place]   Chinita Pester  [phone]   Residential & Outpatient Substance Abuse Program  [phone]   Psychological  Services Organization         Address  Phone  Notes  [facility] [place]  [place]  [phone]   [place] [address], [place] or [phone]    Mobile Crisis Teams Organization         Address  Phone  Notes  [facility], Mobile Crisis Care Unit  [phone]   Assertive Psychotherapeutic Services  [address] [place], [place]   [facility] [address], [place]  [place] [phone]    Self-Help/Support Groups Organization         Address  Phone             Notes  [facility]ental [facility] of [place] - variety of support groups  [place] Call for more information  Narcotics Anonymous (NA), [facility]aring [facility] [address], [employer] [place]  2 meetings at this location   Special educational needs teacher         Address  Phone  Notes  [facility] Residential Treatment [place],    [place]  [phone]   [facility]  [address], [place] [address], [place], [place]   [place]erald Barnard, [place] [phone] Admissions: 8am-3pm M-F  Incentives Substance [place] [address],    [place], [place] [phone]   [facility] [address] [place], [place], [place]   [facility] [address],  [place], [employer]   Insight Programs - Intensive Outpatient [address] Kristeen [address], [place], [place]   [facility] ([place].,  [place], [place] [phone] or [phone]   [facility]idential Treatment Services (RTS) [address] [place], [place] Accepts Medicaid  [facility] [place] [address],  [place] [phone] Substance Abuse/Addiction Treatment   [facility] Organization         Address  Phone  Notes  [facility]  [phone]   Domenic Schwab, PhD [address] Arlis Porta [place], [place]   [phone] or [phone]   [place], [place] [phone]   [facility] [address] [place], [place] [phone] Insurance/Medicaid/sponsorship through [facility] and Families [address] [place]                                    [place], [place] [phone] [place] [address], [place] [phone]    Dr. Adele Schilder  [phone]   [facility] Dept. 1) [address] [place] [address] [place] 2) [place] 3)  [place] [address], [place] [phone]  [phone]   [place] [phone] or [phone] (After Hours)      Edema Edema is an abnormal buildup of fluids in your bodytissues. Edema is somewhatdependent on gravity to pull the fluid to the lowest place in your body. That makes the condition more common in the legs and thighs (lower extremities). Painless swelling of the feet and ankles is common and becomes more likely as you get older. It is also common in looser tissues, like around your eyes.  When the affected area is squeezed, the fluid may move out of that spot and leave a dent for a few moments. This dent is called pitting.  CAUSES  There are many possible causes of edema. Eating too much salt and being on your feet or sitting for a long time can cause edema in your legs and ankles. Hot weather may make edema worse. Common medical causes of edema include:  Heart failure.  Liver disease.  Kidney disease.  Weak blood vessels in your legs.  Cancer.  An  injury.  Pregnancy.  Some medications.  Obesity. SYMPTOMS  Edema is usually painless.Your skin may look swollen or shiny.  DIAGNOSIS  Your health care provider may be able to diagnose edema by asking about your medical  history and doing a physical exam. You may need to have tests such as X-rays, an electrocardiogram, or blood tests to check for medical conditions that may cause edema.  TREATMENT  Edema treatment depends on the cause. If you have heart, liver, or kidney disease, you need the treatment appropriate for these conditions. General treatment may include:  Elevation of the affected body part above the level of your heart.  Compression of the affected body part. Pressure from elastic bandages or support stockings squeezes the tissues and forces fluid back into the blood vessels. This keeps fluid from entering the tissues.  Restriction of fluid and salt intake.  Use of a water pill (diuretic). These medications are appropriate only for some types of edema. They pull fluid out of your body and make you urinate more often. This gets rid of fluid and reduces swelling, but diuretics can have side effects. Only use diuretics as directed by your health care provider. HOME CARE INSTRUCTIONS   Keep the affected body part above the level of your heart when you are lying down.   Do not sit still or stand for prolonged periods.   Do not put anything directly under your knees when lying down.  Do not wear constricting clothing or garters on your upper legs.   Exercise your legs to work the fluid back into your blood vessels. This may help the swelling go down.   Wear elastic bandages or support stockings to reduce ankle swelling as directed by your health care provider.   Eat a low-salt diet to reduce fluid if your health care provider recommends it.   Only take medicines as directed by your health care provider. SEEK MEDICAL CARE IF:   Your edema is not responding to treatment.  You have heart, liver, or kidney disease and notice symptoms of edema.  You have edema in your legs that does not improve after elevating them.   You have sudden and unexplained weight gain. SEEK IMMEDIATE MEDICAL  CARE IF:   You develop shortness of breath or chest pain.   You cannot breathe when you lie down.  You develop pain, redness, or warmth in the swollen areas.   You have heart, liver, or kidney disease and suddenly get edema.  You have a fever and your symptoms suddenly get worse. MAKE SURE YOU:   Understand these instructions.  Will watch your condition.  Will get help right away if you are not doing well or get worse. Document Released: 09/26/2005 Document Revised: 02/10/2014 Document Reviewed: 07/19/2013 [facility] Patient Information 2015 [place], [place]. This information is not intended to replace advice given to you by your health care provider. Make sure you discuss any questions you have with your health care provider.

## 2015-07-11 NOTE — ED Notes (Signed)
MD at bedside. 

## 2015-07-11 NOTE — ED Notes (Signed)
Patient transported to X-ray 

## 2015-07-11 NOTE — ED Notes (Signed)
Pt reports Monday that he had a stomach virus that resolved on Wednesday. Today pt reports that he just does not feel right. Pt reports swelling to B/L ankles and unable to sleep at night. Pt also reports shortness of breath.

## 2015-07-11 NOTE — ED Provider Notes (Signed)
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 He reports not sleeping well at night, he sleeps 3 or 4 hours then he wakes up, he does take it 2 or 3 hour nap in the afternoon. At this time he does not have any focal or specific complaints. On exam he has 1+ bilateral symmetrical pitting edema at the ankles and the shins, full range of motion of all major joints, supple joints, soft compartments, no redness, no rales or wheezing, no distress, speaks in full sentences, irregular heart rate consistent with atrial fibrillation, underlying paced rhythm is present. EKG interpreted, shows A. fib with overlying paced rhythm. Check labs, rule out potassium abnormalities, renal abnormalities, pulmonary edema, anticipate increasing Lasix for several days and follow-up with family doctor.   EKG Interpretation  Date/Time:  Saturday July 11 2015 10:50:26 EDT Ventricular Rate:  76 PR Interval:    QRS Duration: 186 QT Interval:  492 QTC Calculation: 553 R Axis:   -75 Text Interpretation:  VENTRICULAR PACED RHYTHM with underlying afib since last tracing no significant change Confirmed by Sabra Heck  MD, [place] ([id number]) on 07/11/2015 11:10:17 AM      Medical screening examination/treatment/procedure(s) were conducted as a shared visit with non-physician practitioner(s) and myself.  I personally evaluated the patient during the encounter.  Clinical Impression:   Final diagnoses:  Ankle edema         Noemi Chapel, MD 07/13/15 1725

## 2015-07-11 NOTE — ED Provider Notes (Signed)
CSN: [id number]     Arrival date & time 07/11/15  1031 History   First MD Initiated Contact with Patient 07/11/15 1052     Chief Complaint  Patient presents with  . Joint Swelling  . Shortness of Breath     HPI Comments: MICHAIAH HOLSOPPLE is a 69 y.o. Male presents with a feeling of "just not feeling well" for about five days. Patient symptoms began with fatigue, malaise and diarrhea, but symptoms resolved by Wednesday. Pt denies any N/V, shortness of breath, chest pain, dizzinesss, GI complaints or any other complaints. Pt states, "I just came in because I figured it wouldn't be busy and I noticed my ankles were swollen. Plus my daughter wanted me to come in, she's a nurse."  Pt states he has a two year history of cardiac issues and has shortness of breath on exertion at baseline.  No increase in shortness of breath. Pt states his goal from this visit to to get rid of his ankle edema and help with sleeping. Pt takes a two hour nap every afternoon.   Past Medical History  Diagnosis Date  . Hypertension   . Syncope     a. 10/2012 Echo: EF 60-65%, no reg wma, mild MR, mildly dil LA.  . CHB (complete heart block) ([place])     a. 10/2012 s/p SJM 2210 Accent DR RF DC PPM, ser # [id number].  Marland Kitchen A-fib ([place])   . Pacemaker   . Colon cancer metastasized to liver ([place]) ~ [place]    mets to the liver with a liver resection  . Shoulder impingement     "both; flare up q now and then" (05/09/2014)  . Chronic systolic heart failure ([place])   . Other primary cardiomyopathies   . Coronary artery disease   . Shortness of breath     " AT TIMES "  . CHF (congestive heart failure) [facility])    Past Surgical History  Procedure Laterality Date  . Liver resection  ~ 1989    Liver CA  . Insert / replace / remove pacemaker  11/05/2012  . Colon surgery  ~ 1988    "for CA"  . Refractive surgery Bilateral   . Coronary stent placement  06/18/2014    MID CIRC & RCA  . Cardiac catheterization  06/18/2014    DR Irish Lack  .  Permanent pacemaker insertion N/A 11/05/2012    Procedure: PERMANENT PACEMAKER INSERTION;  Surgeon: Evans Lance, MD;  Location: [facility] CATH LAB;  Service: Cardiovascular;  Laterality: N/A;  . Left heart catheterization with coronary angiogram N/A 05/15/2014    Procedure: LEFT HEART CATHETERIZATION WITH CORONARY ANGIOGRAM;  Surgeon: Jettie Booze, MD;  Location: [facility] CATH LAB;  Service: Cardiovascular;  Laterality: N/A;  . Percutaneous coronary stent intervention (pci-s) N/A 06/18/2014    Procedure: PERCUTANEOUS CORONARY STENT INTERVENTION (PCI-S);  Surgeon: Jettie Booze, MD;  Location: [facility] CATH LAB;  Service: Cardiovascular;  Laterality: N/A;  3.0/20 Promus DEstent to distal RCA  . Cardiac catheterization  06/18/2014    Procedure: INTRAVASCULAR PRESSURE WIRE/FFR STUDY;  Surgeon: Jettie Booze, MD;  Location: [facility] CATH LAB;  Service: Cardiovascular;;  MId LAD  . Percutaneous coronary intervention-balloon only  06/18/2014    Procedure: PERCUTANEOUS CORONARY INTERVENTION-BALLOON ONLY;  Surgeon: Jettie Booze, MD;  Location: [facility] CATH LAB;  Service: Cardiovascular;;  CTO Mid CFX   Family History  Problem Relation Age of Onset  . Stroke Mother     53s  .  Stroke Father 26   Social History  Substance Use Topics  . Smoking status: Light Tobacco Smoker -- 53 years    Types: Cigars  . Smokeless tobacco: Former Systems developer    Types: [place] date: 07/10/2014     Comment: 05/09/2014 "I use chew q now and then"  . Alcohol Use: 0.0 oz/week    0 Standard drinks or equivalent per week     Comment: 05/09/2014 "I've had 2 beers in the last 6 months"    Review of Systems    Allergies  Spironolactone  Home Medications   Prior to Admission medications   Medication Sig Start Date End Date Taking? Authorizing Provider  aspirin 325 MG buffered tablet Take 1 tablet (325 mg total) by mouth daily. 11/15/12   Evans Lance, MD  carvedilol (COREG) 3.125 MG tablet Take 1 tablet (3.125 mg total) by  mouth 2 (two) times daily with a meal. 04/29/15   Jettie Booze, MD  clopidogrel (PLAVIX) 75 MG tablet Take 1 tablet (75 mg total) by mouth daily. 04/29/15   Jettie Booze, MD  furosemide (LASIX) 40 MG tablet Take 1 tablet (40 mg total) by mouth daily. 04/29/15   Jettie Booze, MD  lisinopril (PRINIVIL,ZESTRIL) 10 MG tablet Take 1.5 tablets (15 mg total) by mouth daily. 04/29/15   Jettie Booze, MD  Melatonin 1 MG TABS Take 1 tablet (1 mg total) by mouth every evening. 07/11/15   Foy Vanduyne C Natacha Jepsen, PA-C  nitroGLYCERIN (NITROSTAT) 0.4 MG SL tablet Place 1 tablet (0.4 mg total) under the tongue every 5 (five) minutes as needed for chest pain. 06/19/14   Brett Canales, PA-C  OVER THE COUNTER MEDICATION Take 1 packet by mouth daily. Vitamin packet    Historical Provider, MD  potassium chloride (K-DUR) 10 MEQ tablet Take 1 tablet (10 mEq total) by mouth daily. 04/29/15   Jettie Booze, MD   BP 154/67 mmHg  Pulse 91  Temp(Src) 97.6 F (36.4 C) (Oral)  Resp 17  Ht 5\' 9"  (1.753 m)  Wt 218 lb 8 oz (99.111 kg)  BMI 32.25 kg/m2  SpO2 99% Physical Exam  Constitutional: He is oriented to person, place, and time. He appears well-developed and well-nourished. No distress.  HENT:  Head: Normocephalic and atraumatic.  Eyes: Conjunctivae and EOM are normal. Pupils are equal, round, and reactive to light.  Cardiovascular: Normal rate, regular rhythm and normal heart sounds.   Pulmonary/Chest: Effort normal and breath sounds normal. No respiratory distress.  Abdominal: Soft. Bowel sounds are normal.  Musculoskeletal: He exhibits edema. He exhibits no tenderness.  Minor ankle edema bilaterally per patient.  Neurological: He is alert and oriented to person, place, and time.  Skin: Skin is warm and dry. He is not diaphoretic.  Nursing note and vitals reviewed.   ED Course  Procedures (including critical care time) Labs Review Labs Reviewed  BASIC METABOLIC PANEL - Abnormal; Notable for  the following:    CO2 21 (*)    Glucose, Bld 117 (*)    Calcium 8.7 (*)    All other components within normal limits  CBC - Abnormal; Notable for the following:    Platelets 120 (*)    All other components within normal limits  I-STAT TROPOININ, ED    Imaging Review Dg Chest 2 View  07/11/2015   CLINICAL DATA:  Short of breath  EXAM: CHEST  2 VIEW  COMPARISON:  05/08/2014  FINDINGS: Cardiac enlargement with  normal vascularity and no edema. Small right effusion and mild right lower lobe atelectasis. Dual lead pacemaker unchanged. Left lung clear.  IMPRESSION: Cardiac enlargement with small right pleural effusion. Negative for pulmonary edema.   Electronically Signed   By: Franchot Gallo M.D.   On: 07/11/2015 12:02   I have personally reviewed and evaluated these images and lab results as part of my medical decision-making.   EKG Interpretation   Date/Time:  Saturday July 11 2015 10:50:26 EDT Ventricular Rate:  76 PR Interval:    QRS Duration: 186 QT Interval:  492 QTC Calculation: 553 R Axis:   -75 Text Interpretation:  VENTRICULAR PACED RHYTHM with underlying afib since  last tracing no significant change Confirmed by MILLER  MD, BRIAN ([id number])  on 07/11/2015 11:10:17 AM      MDM   Final diagnoses:  Ankle edema    COLTON TASSIN presents with "not feeling well" for the past 5 days. Has significant cardiac history and CHF.  Findings and plan of care discussed with Dr. Sabra Heck.  Troponin is 0.03. EKG shows a ventricular paced rhythm. CBC and BMP abnormalities consistent with previous draws.   Pt advised to increase his lasix dose for the next two doses and followup with PCP.  Pt also advised that his sleep difficulties could be due to his daily afternoon naps.  CXR shows mild pleural effusion on the right lower lobe, negative for pulmonary edema.  Pt to be discharged with instructions to return to ED should symptoms worsen.   Lorayne Bender, PA-C 07/11/15 1234  Noemi Chapel, MD 07/13/15 1725

## 2015-08-11 ENCOUNTER — Ambulatory Visit: Payer: Medicare Other | Admitting: *Deleted

## 2015-08-11 DIAGNOSIS — I442 Atrioventricular block, complete: Secondary | ICD-10-CM

## 2015-08-12 NOTE — Progress Notes (Signed)
Remote pacemaker transmission.   

## 2015-08-21 ENCOUNTER — Inpatient Hospital Stay (HOSPITAL_COMMUNITY)
Admission: EM | Admit: 2015-08-21 | Discharge: 2015-09-10 | DRG: 871 | Disposition: E | Payer: Medicare Other | Attending: Pulmonary Disease | Admitting: Pulmonary Disease

## 2015-08-21 ENCOUNTER — Emergency Department (HOSPITAL_COMMUNITY): Payer: Medicare Other

## 2015-08-21 DIAGNOSIS — Z85038 Personal history of other malignant neoplasm of large intestine: Secondary | ICD-10-CM

## 2015-08-21 DIAGNOSIS — Z888 Allergy status to other drugs, medicaments and biological substances status: Secondary | ICD-10-CM

## 2015-08-21 DIAGNOSIS — G92 Toxic encephalopathy: Secondary | ICD-10-CM | POA: Diagnosis present

## 2015-08-21 DIAGNOSIS — Z8505 Personal history of malignant neoplasm of liver: Secondary | ICD-10-CM

## 2015-08-21 DIAGNOSIS — I469 Cardiac arrest, cause unspecified: Secondary | ICD-10-CM | POA: Diagnosis not present

## 2015-08-21 DIAGNOSIS — A419 Sepsis, unspecified organism: Principal | ICD-10-CM | POA: Diagnosis present

## 2015-08-21 DIAGNOSIS — J189 Pneumonia, unspecified organism: Secondary | ICD-10-CM | POA: Diagnosis present

## 2015-08-21 DIAGNOSIS — E872 Acidosis: Secondary | ICD-10-CM | POA: Diagnosis present

## 2015-08-21 DIAGNOSIS — J9690 Respiratory failure, unspecified, unspecified whether with hypoxia or hypercapnia: Secondary | ICD-10-CM

## 2015-08-21 DIAGNOSIS — Z7982 Long term (current) use of aspirin: Secondary | ICD-10-CM

## 2015-08-21 DIAGNOSIS — I481 Persistent atrial fibrillation: Secondary | ICD-10-CM | POA: Diagnosis present

## 2015-08-21 DIAGNOSIS — Y848 Other medical procedures as the cause of abnormal reaction of the patient, or of later complication, without mention of misadventure at the time of the procedure: Secondary | ICD-10-CM | POA: Diagnosis present

## 2015-08-21 DIAGNOSIS — J9601 Acute respiratory failure with hypoxia: Secondary | ICD-10-CM | POA: Diagnosis present

## 2015-08-21 DIAGNOSIS — Z955 Presence of coronary angioplasty implant and graft: Secondary | ICD-10-CM

## 2015-08-21 DIAGNOSIS — R4182 Altered mental status, unspecified: Secondary | ICD-10-CM

## 2015-08-21 DIAGNOSIS — S22069A Unspecified fracture of T7-T8 vertebra, initial encounter for closed fracture: Secondary | ICD-10-CM | POA: Diagnosis present

## 2015-08-21 DIAGNOSIS — Z66 Do not resuscitate: Secondary | ICD-10-CM | POA: Diagnosis not present

## 2015-08-21 DIAGNOSIS — I110 Hypertensive heart disease with heart failure: Secondary | ICD-10-CM | POA: Diagnosis present

## 2015-08-21 DIAGNOSIS — S2241XA Multiple fractures of ribs, right side, initial encounter for closed fracture: Secondary | ICD-10-CM | POA: Diagnosis present

## 2015-08-21 DIAGNOSIS — Z7902 Long term (current) use of antithrombotics/antiplatelets: Secondary | ICD-10-CM

## 2015-08-21 DIAGNOSIS — I429 Cardiomyopathy, unspecified: Secondary | ICD-10-CM | POA: Diagnosis present

## 2015-08-21 DIAGNOSIS — R042 Hemoptysis: Secondary | ICD-10-CM | POA: Diagnosis present

## 2015-08-21 DIAGNOSIS — R791 Abnormal coagulation profile: Secondary | ICD-10-CM | POA: Diagnosis present

## 2015-08-21 DIAGNOSIS — S2222XA Fracture of body of sternum, initial encounter for closed fracture: Secondary | ICD-10-CM | POA: Diagnosis present

## 2015-08-21 DIAGNOSIS — I4901 Ventricular fibrillation: Secondary | ICD-10-CM | POA: Diagnosis present

## 2015-08-21 DIAGNOSIS — I5022 Chronic systolic (congestive) heart failure: Secondary | ICD-10-CM | POA: Diagnosis present

## 2015-08-21 DIAGNOSIS — F17200 Nicotine dependence, unspecified, uncomplicated: Secondary | ICD-10-CM | POA: Diagnosis present

## 2015-08-21 DIAGNOSIS — J9602 Acute respiratory failure with hypercapnia: Secondary | ICD-10-CM | POA: Diagnosis present

## 2015-08-21 DIAGNOSIS — R570 Cardiogenic shock: Secondary | ICD-10-CM | POA: Diagnosis present

## 2015-08-21 DIAGNOSIS — R6521 Severe sepsis with septic shock: Secondary | ICD-10-CM | POA: Diagnosis present

## 2015-08-21 DIAGNOSIS — Z823 Family history of stroke: Secondary | ICD-10-CM

## 2015-08-21 DIAGNOSIS — Z79899 Other long term (current) drug therapy: Secondary | ICD-10-CM

## 2015-08-21 DIAGNOSIS — I2510 Atherosclerotic heart disease of native coronary artery without angina pectoris: Secondary | ICD-10-CM | POA: Diagnosis present

## 2015-08-21 DIAGNOSIS — N179 Acute kidney failure, unspecified: Secondary | ICD-10-CM | POA: Diagnosis present

## 2015-08-21 DIAGNOSIS — Z950 Presence of cardiac pacemaker: Secondary | ICD-10-CM

## 2015-08-21 DIAGNOSIS — R579 Shock, unspecified: Secondary | ICD-10-CM | POA: Insufficient documentation

## 2015-08-21 LAB — I-STAT ARTERIAL BLOOD GAS, ED
Acid-Base Excess: 2 mmol/L (ref 0.0–2.0)
Bicarbonate: 30.8 mEq/L — ABNORMAL HIGH (ref 20.0–24.0)
O2 Saturation: 95 %
PCO2 ART: 66.5 mmHg — AB (ref 35.0–45.0)
PH ART: 7.27 — AB (ref 7.350–7.450)
TCO2: 33 mmol/L (ref 0–100)
pO2, Arterial: 86 mmHg (ref 80.0–100.0)

## 2015-08-21 LAB — I-STAT TROPONIN, ED: TROPONIN I, POC: 0.32 ng/mL — AB (ref 0.00–0.08)

## 2015-08-21 MED ORDER — MIDAZOLAM HCL 2 MG/2ML IJ SOLN
4.0000 mg | Freq: Once | INTRAMUSCULAR | Status: AC
  Administered 2015-08-21: 4 mg via INTRAVENOUS
  Filled 2015-08-21: qty 4

## 2015-08-21 MED ORDER — DEXTROSE 5 % IV SOLN
0.5000 ug/min | INTRAVENOUS | Status: DC
  Administered 2015-08-21: 2 ug/min via INTRAVENOUS
  Administered 2015-08-22: 5 ug/min via INTRAVENOUS
  Administered 2015-08-22: 10 ug/min via INTRAVENOUS
  Filled 2015-08-21 (×2): qty 4

## 2015-08-21 MED ORDER — CALCIUM CHLORIDE 10 % IV SOLN
INTRAVENOUS | Status: AC | PRN
  Administered 2015-08-21: 1 g via INTRAVENOUS

## 2015-08-21 MED ORDER — SODIUM BICARBONATE 8.4 % IV SOLN
INTRAVENOUS | Status: AC
  Filled 2015-08-21: qty 150

## 2015-08-21 MED ORDER — FENTANYL CITRATE (PF) 100 MCG/2ML IJ SOLN
100.0000 ug | Freq: Once | INTRAMUSCULAR | Status: AC
  Administered 2015-08-22: 100 ug via INTRAVENOUS
  Filled 2015-08-21: qty 2

## 2015-08-21 MED ORDER — DEXTROSE 5 % IV SOLN
150.0000 mg | INTRAVENOUS | Status: AC | PRN
  Administered 2015-08-21: 150 mg via INTRAVENOUS

## 2015-08-21 MED ORDER — SODIUM CHLORIDE 0.9 % IV SOLN
Freq: Once | INTRAVENOUS | Status: AC
  Administered 2015-08-21: via INTRAVENOUS

## 2015-08-21 MED ORDER — STERILE WATER FOR INJECTION IV SOLN
INTRAVENOUS | Status: DC
  Administered 2015-08-21 – 2015-08-22 (×2): via INTRAVENOUS
  Filled 2015-08-21 (×3): qty 850

## 2015-08-21 MED ORDER — AMIODARONE HCL IN DEXTROSE 360-4.14 MG/200ML-% IV SOLN
INTRAVENOUS | Status: AC
  Administered 2015-08-21: 60 mg/h via INTRAVENOUS
  Filled 2015-08-21: qty 200

## 2015-08-21 MED ORDER — EPINEPHRINE HCL 0.1 MG/ML IJ SOSY
PREFILLED_SYRINGE | INTRAMUSCULAR | Status: AC | PRN
  Administered 2015-08-21 (×2): 1 mg via INTRAVENOUS

## 2015-08-21 MED ORDER — SODIUM BICARBONATE 8.4 % IV SOLN
INTRAVENOUS | Status: AC | PRN
  Administered 2015-08-21 – 2015-08-22 (×4): 50 meq via INTRAVENOUS

## 2015-08-21 NOTE — ED Provider Notes (Signed)
CSN: [id number]     Arrival date & time 08/25/2015  2308 History  By signing my name below, I, Evelene Croon, attest that this documentation has been prepared under the direction and in the presence of Jola Schmidt, MD . Electronically Signed: Evelene Croon, Scribe. 2015/09/17. 12:30 AM.  No chief complaint on file.  LEVEL 5 CAVEAT DUE TO ACUITY OF MEDICAL CONDITION   The history is provided by the EMS personnel and medical records. No language interpreter was used.    HPI Comments:  Derek Odonnell is a 69 y.o. male brought in by ambulance, who presents to the Emergency Department pulseless with resumption of CPR on arrival to the resuscitation bay.  EMS arrived on scene ~ 2220 this evening. Per EMS, pt had syncopal episode in the middle of a locker room. PMHx was not given to EMS on scene but they believe he has a pacemaker and suspect he may have taken NTG prior to LOC as they found NTG near the pt. They state pt temporarily regained pulses en route. He was given 7 rounds of epi, had IO placed in LLE, and was intubated en route. EMS notes they suctioned a large amount of blood from the pt's airway PTA.  Per chart review pt has a history of HTN, Afib, complete heart block, coronary stents, cardiac catheterization and CHF  Past Medical History  Diagnosis Date  . Hypertension   . Syncope     a. 10/2012 Echo: EF 60-65%, no reg wma, mild MR, mildly dil LA.  . CHB (complete heart block) ([place])     a. 10/2012 s/p SJM 2210 [address] DR RF DC PPM, ser # [id number].  Marland Kitchen A-fib ([place])   . Pacemaker   . Colon cancer metastasized to liver ([place]) ~ [place]    mets to the liver with a liver resection  . Shoulder impingement     "both; flare up q now and then" (05/09/2014)  . Chronic systolic heart failure ([place])   . Other primary cardiomyopathies   . Coronary artery disease   . Shortness of breath     " AT TIMES "  . CHF (congestive heart failure) [facility])    Past Surgical History  Procedure Laterality Date   . Liver resection  ~ 1989    Liver CA  . Insert / replace / remove pacemaker  11/05/2012  . Colon surgery  ~ 1988    "for CA"  . Refractive surgery Bilateral   . Coronary stent placement  06/18/2014    MID CIRC & RCA  . Cardiac catheterization  06/18/2014    DR Irish Lack  . Permanent pacemaker insertion N/A 11/05/2012    Procedure: PERMANENT PACEMAKER INSERTION;  Surgeon: Evans Lance, MD;  Location: [facility] CATH LAB;  Service: Cardiovascular;  Laterality: N/A;  . Left heart catheterization with coronary angiogram N/A 05/15/2014    Procedure: LEFT HEART CATHETERIZATION WITH CORONARY ANGIOGRAM;  Surgeon: Jettie Booze, MD;  Location: [facility] CATH LAB;  Service: Cardiovascular;  Laterality: N/A;  . Percutaneous coronary stent intervention (pci-s) N/A 06/18/2014    Procedure: PERCUTANEOUS CORONARY STENT INTERVENTION (PCI-S);  Surgeon: Jettie Booze, MD;  Location: [facility] CATH LAB;  Service: Cardiovascular;  Laterality: N/A;  3.0/20 Promus DEstent to distal RCA  . Cardiac catheterization  06/18/2014    Procedure: INTRAVASCULAR PRESSURE WIRE/FFR STUDY;  Surgeon: Jettie Booze, MD;  Location: [facility] CATH LAB;  Service: Cardiovascular;;  MId LAD  . Percutaneous coronary intervention-balloon only  06/18/2014  Procedure: PERCUTANEOUS CORONARY INTERVENTION-BALLOON ONLY;  Surgeon: Jettie Booze, MD;  Location: [facility] CATH LAB;  Service: Cardiovascular;;  CTO Mid CFX   Family History  Problem Relation Age of Onset  . Stroke Mother     36s  . Stroke Father 49   Social History  Substance Use Topics  . Smoking status: Light Tobacco Smoker -- 53 years    Types: Cigars  . Smokeless tobacco: Former Systems developer    Types: [place] date: 07/10/2014     Comment: 05/09/2014 "I use chew q now and then"  . Alcohol Use: 0.0 oz/week    0 Standard drinks or equivalent per week     Comment: 05/09/2014 "I've had 2 beers in the last 6 months"    Review of Systems  Unable to perform ROS: Acuity of condition     Allergies  Spironolactone  Home Medications   Prior to Admission medications   Medication Sig Start Date End Date Taking? Authorizing Provider  aspirin 325 MG buffered tablet Take 1 tablet (325 mg total) by mouth daily. 11/15/12   Evans Lance, MD  carvedilol (COREG) 3.125 MG tablet Take 1 tablet (3.125 mg total) by mouth 2 (two) times daily with a meal. 04/29/15   Jettie Booze, MD  clopidogrel (PLAVIX) 75 MG tablet Take 1 tablet (75 mg total) by mouth daily. 04/29/15   Jettie Booze, MD  furosemide (LASIX) 40 MG tablet Take 1 tablet (40 mg total) by mouth daily. 04/29/15   Jettie Booze, MD  lisinopril (PRINIVIL,ZESTRIL) 10 MG tablet Take 1.5 tablets (15 mg total) by mouth daily. 04/29/15   Jettie Booze, MD  Melatonin 1 MG TABS Take 1 tablet (1 mg total) by mouth every evening. 07/11/15   Shawn C Joy, PA-C  nitroGLYCERIN (NITROSTAT) 0.4 MG SL tablet Place 1 tablet (0.4 mg total) under the tongue every 5 (five) minutes as needed for chest pain. 06/19/14   Brett Canales, PA-C  OVER THE COUNTER MEDICATION Take 1 packet by mouth daily. Vitamin packet    Historical Provider, MD  potassium chloride (K-DUR) 10 MEQ tablet Take 1 tablet (10 mEq total) by mouth daily. 04/29/15   Jettie Booze, MD   BP 61/46 mmHg  Pulse 51  Resp 27  SpO2 93% Physical Exam  Constitutional: He appears well-developed and well-nourished.  HENT:  Head: Normocephalic and atraumatic.  Eyes: Pupils are equal, round, and reactive to light.  Neck: Neck supple. No tracheal deviation present.  Cardiovascular:  Right femoral pulse present at 2313 Right radial pulse also present at this time  Pulmonary/Chest: No stridor. He is in respiratory distress.  Decreased breath sounds bilaterally  Abdominal: Soft. He exhibits distension.  Musculoskeletal: He exhibits no edema.  Intraosseous line in left anterior tibia  Neurological:  GCS 3  Skin: Skin is warm and dry.  Psychiatric: He has a normal  mood and affect. Judgment normal.  Nursing note and vitals reviewed.   ED Course  Procedures   CRITICAL CARE Performed by: Hoy Morn Total critical care time: 40 minutes Critical care time was exclusive of separately billable procedures and treating other patients. Critical care was necessary to treat or prevent imminent or life-threatening deterioration. Critical care was time spent personally by me on the following activities: development of treatment plan with patient and/or surrogate as well as nursing, discussions with consultants, evaluation of patient's response to treatment, examination of patient, obtaining history from patient or surrogate, ordering  and performing treatments and interventions, ordering and review of laboratory studies, ordering and review of radiographic studies, pulse oximetry and re-evaluation of patient's condition.  Cardiopulmonary Resuscitation (CPR) Procedure Note Directed/Performed by: Hoy Morn I personally directed ancillary staff and/or performed CPR in an effort to regain return of spontaneous circulation and to maintain cardiac, neuro and systemic perfusion.     Labs Review Labs Reviewed  CBC - Abnormal; Notable for the following:    WBC 12.4 (*)    Platelets 113 (*)    All other components within normal limits  DIFFERENTIAL - Abnormal; Notable for the following:    Neutro Abs 8.2 (*)    All other components within normal limits  PROTIME-INR - Abnormal; Notable for the following:    Prothrombin Time 23.5 (*)    INR 2.11 (*)    All other components within normal limits  APTT - Abnormal; Notable for the following:    aPTT 86 (*)    All other components within normal limits  COMPREHENSIVE METABOLIC PANEL - Abnormal; Notable for the following:    Chloride 100 (*)    CO2 20 (*)    Glucose, Bld 294 (*)    Creatinine, Ser 1.45 (*)    Total Protein 5.6 (*)    Albumin 2.6 (*)    AST 85 (*)    GFR calc non Af Amer 47 (*)    GFR calc  Af Amer 55 (*)    Anion gap 17 (*)    All other components within normal limits  TROPONIN I - Abnormal; Notable for the following:    Troponin I 0.30 (*)    All other components within normal limits  I-STAT TROPOININ, ED - Abnormal; Notable for the following:    Troponin i, poc 0.32 (*)    All other components within normal limits  I-STAT ARTERIAL BLOOD GAS, ED - Abnormal; Notable for the following:    pH, Arterial 7.270 (*)    pCO2 arterial 66.5 (*)    Bicarbonate 30.8 (*)    All other components within normal limits  MAGNESIUM  CARBOXYHEMOGLOBIN    Cardiac cath 9 2015  IMPRESSIONS:  1. Widely patent left main coronary artery. 2. Moderate disease in the left anterior descending artery which was not significant by FFR, and will be medically managed. 3. 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 This was successfully ballooned and there was a 20% residual stenosis at the original occlusion. However, it appears there may have been a more distal occlusion which was limiting run off. 4. Successful PCI of the distal 90% stenosis of the right coronary artery with a 3 x 20 mm Promus drug-eluting stent, postdilated to 3.3 mm in diameter. 5.  Imaging Review Dg Chest Portable 1 View  08/13/2015  CLINICAL DATA:  Status post cardiac arrest. Hypoxia, acute onset. Initial encounter. EXAM: PORTABLE CHEST 1 VIEW COMPARISON:  Chest radiograph performed 07/11/2015 FINDINGS: The patient's endotracheal tube is seen ending 5 cm above the carina. The enteric tube is noted extending below the diaphragm. Diffuse left-sided airspace opacification raises concern for pneumonia. Asymmetric pulmonary edema is considered less likely. No definite pleural effusion or pneumothorax is seen, though the right costophrenic angle is incompletely imaged on this study. The cardiomediastinal silhouette is mildly enlarged. No acute osseous abnormalities are identified. External pacing pads are noted. A pacemaker is  noted overlying the left chest wall, with leads ending overlying the right atrium and right ventricle. IMPRESSION: 1. Endotracheal tube seen ending 5  cm above the carina. 2. Diffuse left-sided airspace opacification is concerning for pneumonia. Asymmetric pulmonary edema is considered less likely. Electronically Signed   By: Garald Balding M.D.   On: 08/20/2015 23:51   I have personally reviewed and evaluated these images and lab results as part of my medical decision-making.   EKG Interpretation  Date/Time:  Friday August 21 2015 23:36:50 EST Ventricular Rate:  78 PR Interval:    QRS Duration: 172 QT Interval:  436 QTC Calculation: 497 R Axis:   113 Text Interpretation:  Atrial fibrillation Paired ventricular premature complexes Nonspecific intraventricular conduction delay ST depr, consider ischemia, inferior leads subtle changes noted in inferior leads compared to earlier ecg Confirmed by Adline Kirshenbaum  MD, Sahvannah Rieser ([id number]) on 2015-09-06 12:28:22 AM        MDM   Final diagnoses:  Cardiac arrest [facility])  Atherosclerosis of native coronary artery without angina pectoris, unspecified whether native or transplanted heart    Discussed case with  critical care and cardiology.  No clear-cut ST elevation on EKG to suggest need for urgent Cath Lab evaluation.  Does have subtle inferior ischemic changes.  His device will be interrogated at this time.  He was very epi and bicarbonate dependent.  He received multiple repeat doses of epinephrine was started on epinephrine drip.  He was given multiple bicarbonate boluses and was started on a bicarbonate drip as well.  Ectopy was present and he had multiple short nonsustained runs of ventricular tachycardia thus he was given 150 mg of amiodarone and started on amiodarone drip.  Patient's wife updated.  I discussed with critical care the possibility of hypothermia protocol at this time that he feels though his resuscitation time and CPR time of 40 minutes is too  long to warrant hypothermia protocol.     I personally performed the services described in this documentation, which was scribed in my presence. The recorded information has been reviewed and is accurate.       Jola Schmidt, MD 09/06/15 [id number]

## 2015-08-21 NOTE — Code Documentation (Signed)
Pt being bagged at this time by resp.

## 2015-08-21 NOTE — Progress Notes (Signed)
   08/23/2015 2300  Clinical Encounter Type  Visited With Health care provider;Other (Comment)  Visit Type Code (Young man who plays for him)  Referral From Nurse  Chaplain visited with healthcare provider and young man; Chaplain noticed young man a little teary eyed; Chaplain will stay until more family come

## 2015-08-21 NOTE — Code Documentation (Signed)
CPR held for pulse check.  Femoral pulse present

## 2015-08-21 NOTE — Code Documentation (Signed)
Pt with spontaneous resp.  Pt remains on vent

## 2015-08-21 NOTE — ED Notes (Signed)
Dr. Ashok Cordia at bedside for central line placement

## 2015-08-22 ENCOUNTER — Inpatient Hospital Stay (HOSPITAL_COMMUNITY): Payer: Medicare Other

## 2015-08-22 ENCOUNTER — Encounter (HOSPITAL_COMMUNITY): Payer: Self-pay | Admitting: Emergency Medicine

## 2015-08-22 ENCOUNTER — Other Ambulatory Visit (HOSPITAL_COMMUNITY): Payer: Medicare Other

## 2015-08-22 ENCOUNTER — Emergency Department (HOSPITAL_COMMUNITY): Payer: Medicare Other

## 2015-08-22 DIAGNOSIS — Y848 Other medical procedures as the cause of abnormal reaction of the patient, or of later complication, without mention of misadventure at the time of the procedure: Secondary | ICD-10-CM | POA: Diagnosis present

## 2015-08-22 DIAGNOSIS — N179 Acute kidney failure, unspecified: Secondary | ICD-10-CM | POA: Diagnosis present

## 2015-08-22 DIAGNOSIS — E872 Acidosis: Secondary | ICD-10-CM | POA: Diagnosis present

## 2015-08-22 DIAGNOSIS — Z85038 Personal history of other malignant neoplasm of large intestine: Secondary | ICD-10-CM | POA: Diagnosis not present

## 2015-08-22 DIAGNOSIS — A419 Sepsis, unspecified organism: Principal | ICD-10-CM

## 2015-08-22 DIAGNOSIS — Z8505 Personal history of malignant neoplasm of liver: Secondary | ICD-10-CM | POA: Diagnosis not present

## 2015-08-22 DIAGNOSIS — Z955 Presence of coronary angioplasty implant and graft: Secondary | ICD-10-CM | POA: Diagnosis not present

## 2015-08-22 DIAGNOSIS — I2510 Atherosclerotic heart disease of native coronary artery without angina pectoris: Secondary | ICD-10-CM | POA: Diagnosis present

## 2015-08-22 DIAGNOSIS — J9601 Acute respiratory failure with hypoxia: Secondary | ICD-10-CM

## 2015-08-22 DIAGNOSIS — I469 Cardiac arrest, cause unspecified: Secondary | ICD-10-CM | POA: Diagnosis present

## 2015-08-22 DIAGNOSIS — I4901 Ventricular fibrillation: Secondary | ICD-10-CM | POA: Diagnosis present

## 2015-08-22 DIAGNOSIS — Z7982 Long term (current) use of aspirin: Secondary | ICD-10-CM | POA: Diagnosis not present

## 2015-08-22 DIAGNOSIS — R579 Shock, unspecified: Secondary | ICD-10-CM | POA: Insufficient documentation

## 2015-08-22 DIAGNOSIS — Z66 Do not resuscitate: Secondary | ICD-10-CM | POA: Diagnosis not present

## 2015-08-22 DIAGNOSIS — I481 Persistent atrial fibrillation: Secondary | ICD-10-CM

## 2015-08-22 DIAGNOSIS — J189 Pneumonia, unspecified organism: Secondary | ICD-10-CM | POA: Diagnosis present

## 2015-08-22 DIAGNOSIS — I214 Non-ST elevation (NSTEMI) myocardial infarction: Secondary | ICD-10-CM

## 2015-08-22 DIAGNOSIS — I429 Cardiomyopathy, unspecified: Secondary | ICD-10-CM | POA: Diagnosis present

## 2015-08-22 DIAGNOSIS — R791 Abnormal coagulation profile: Secondary | ICD-10-CM | POA: Diagnosis present

## 2015-08-22 DIAGNOSIS — I5022 Chronic systolic (congestive) heart failure: Secondary | ICD-10-CM | POA: Diagnosis present

## 2015-08-22 DIAGNOSIS — Z7902 Long term (current) use of antithrombotics/antiplatelets: Secondary | ICD-10-CM | POA: Diagnosis not present

## 2015-08-22 DIAGNOSIS — F17200 Nicotine dependence, unspecified, uncomplicated: Secondary | ICD-10-CM | POA: Diagnosis present

## 2015-08-22 DIAGNOSIS — Z823 Family history of stroke: Secondary | ICD-10-CM | POA: Diagnosis not present

## 2015-08-22 DIAGNOSIS — R4020 Unspecified coma: Secondary | ICD-10-CM

## 2015-08-22 DIAGNOSIS — G92 Toxic encephalopathy: Secondary | ICD-10-CM | POA: Diagnosis present

## 2015-08-22 DIAGNOSIS — R570 Cardiogenic shock: Secondary | ICD-10-CM | POA: Diagnosis present

## 2015-08-22 DIAGNOSIS — I255 Ischemic cardiomyopathy: Secondary | ICD-10-CM

## 2015-08-22 DIAGNOSIS — R042 Hemoptysis: Secondary | ICD-10-CM | POA: Diagnosis present

## 2015-08-22 DIAGNOSIS — J9602 Acute respiratory failure with hypercapnia: Secondary | ICD-10-CM | POA: Diagnosis present

## 2015-08-22 DIAGNOSIS — Z950 Presence of cardiac pacemaker: Secondary | ICD-10-CM | POA: Diagnosis not present

## 2015-08-22 DIAGNOSIS — S2241XA Multiple fractures of ribs, right side, initial encounter for closed fracture: Secondary | ICD-10-CM | POA: Diagnosis present

## 2015-08-22 DIAGNOSIS — Z79899 Other long term (current) drug therapy: Secondary | ICD-10-CM | POA: Diagnosis not present

## 2015-08-22 DIAGNOSIS — I5021 Acute systolic (congestive) heart failure: Secondary | ICD-10-CM

## 2015-08-22 DIAGNOSIS — R6521 Severe sepsis with septic shock: Secondary | ICD-10-CM | POA: Diagnosis present

## 2015-08-22 DIAGNOSIS — Z888 Allergy status to other drugs, medicaments and biological substances status: Secondary | ICD-10-CM | POA: Diagnosis not present

## 2015-08-22 DIAGNOSIS — S22069A Unspecified fracture of T7-T8 vertebra, initial encounter for closed fracture: Secondary | ICD-10-CM | POA: Diagnosis present

## 2015-08-22 DIAGNOSIS — S2222XA Fracture of body of sternum, initial encounter for closed fracture: Secondary | ICD-10-CM | POA: Diagnosis present

## 2015-08-22 DIAGNOSIS — I110 Hypertensive heart disease with heart failure: Secondary | ICD-10-CM | POA: Diagnosis present

## 2015-08-22 LAB — CBC
HCT: 49.2 % (ref 39.0–52.0)
Hemoglobin: 16 g/dL (ref 13.0–17.0)
MCH: 32.1 pg (ref 26.0–34.0)
MCHC: 32.5 g/dL (ref 30.0–36.0)
MCV: 98.6 fL (ref 78.0–100.0)
PLATELETS: 113 10*3/uL — AB (ref 150–400)
RBC: 4.99 MIL/uL (ref 4.22–5.81)
RDW: 13.6 % (ref 11.5–15.5)
WBC: 12.4 10*3/uL — AB (ref 4.0–10.5)

## 2015-08-22 LAB — PROCALCITONIN: Procalcitonin: 0.85 ng/mL

## 2015-08-22 LAB — HEPATIC FUNCTION PANEL
ALBUMIN: 2.4 g/dL — AB (ref 3.5–5.0)
ALT: 48 U/L (ref 17–63)
AST: 115 U/L — AB (ref 15–41)
Alkaline Phosphatase: 71 U/L (ref 38–126)
BILIRUBIN DIRECT: 0.5 mg/dL (ref 0.1–0.5)
Indirect Bilirubin: 1 mg/dL — ABNORMAL HIGH (ref 0.3–0.9)
Total Bilirubin: 1.5 mg/dL — ABNORMAL HIGH (ref 0.3–1.2)
Total Protein: 5.1 g/dL — ABNORMAL LOW (ref 6.5–8.1)

## 2015-08-22 LAB — ABO/RH: ABO/RH(D): B POS

## 2015-08-22 LAB — TYPE AND SCREEN
ABO/RH(D): B POS
ANTIBODY SCREEN: NEGATIVE

## 2015-08-22 LAB — RAPID URINE DRUG SCREEN, HOSP PERFORMED
Amphetamines: NOT DETECTED
BENZODIAZEPINES: POSITIVE — AB
Barbiturates: NOT DETECTED
Cocaine: NOT DETECTED
Opiates: NOT DETECTED
Tetrahydrocannabinol: NOT DETECTED

## 2015-08-22 LAB — CBC WITH DIFFERENTIAL/PLATELET
BASOS ABS: 0 10*3/uL (ref 0.0–0.1)
Basophils Relative: 0 %
EOS PCT: 0 %
Eosinophils Absolute: 0 10*3/uL (ref 0.0–0.7)
HEMATOCRIT: 43.9 % (ref 39.0–52.0)
HEMOGLOBIN: 14.6 g/dL (ref 13.0–17.0)
LYMPHS ABS: 1.8 10*3/uL (ref 0.7–4.0)
LYMPHS PCT: 8 %
MCH: 32.2 pg (ref 26.0–34.0)
MCHC: 33.3 g/dL (ref 30.0–36.0)
MCV: 96.7 fL (ref 78.0–100.0)
MONOS PCT: 1 %
Monocytes Absolute: 0.2 10*3/uL (ref 0.1–1.0)
NEUTROS ABS: 20 10*3/uL — AB (ref 1.7–7.7)
Neutrophils Relative %: 91 %
Platelets: 143 10*3/uL — ABNORMAL LOW (ref 150–400)
RBC: 4.54 MIL/uL (ref 4.22–5.81)
RDW: 13.6 % (ref 11.5–15.5)
WBC: 22 10*3/uL — ABNORMAL HIGH (ref 4.0–10.5)

## 2015-08-22 LAB — RENAL FUNCTION PANEL
ALBUMIN: 2.3 g/dL — AB (ref 3.5–5.0)
ANION GAP: 18 — AB (ref 5–15)
BUN: 25 mg/dL — AB (ref 6–20)
CHLORIDE: 99 mmol/L — AB (ref 101–111)
CO2: 20 mmol/L — ABNORMAL LOW (ref 22–32)
Calcium: 7.8 mg/dL — ABNORMAL LOW (ref 8.9–10.3)
Creatinine, Ser: 1.84 mg/dL — ABNORMAL HIGH (ref 0.61–1.24)
GFR calc Af Amer: 41 mL/min — ABNORMAL LOW (ref >60)
GFR calc non Af Amer: 35 mL/min — ABNORMAL LOW (ref >60)
GLUCOSE: 360 mg/dL — AB (ref 65–99)
PHOSPHORUS: 7.6 mg/dL — AB (ref 2.5–4.6)
POTASSIUM: 4.6 mmol/L (ref 3.5–5.1)
Sodium: 137 mmol/L (ref 135–145)

## 2015-08-22 LAB — POCT I-STAT 3, ART BLOOD GAS (G3+)
ACID-BASE DEFICIT: 8 mmol/L — AB (ref 0.0–2.0)
Acid-base deficit: 3 mmol/L — ABNORMAL HIGH (ref 0.0–2.0)
Bicarbonate: 21 mEq/L (ref 20.0–24.0)
Bicarbonate: 25.3 mEq/L — ABNORMAL HIGH (ref 20.0–24.0)
O2 SAT: 96 %
O2 Saturation: 95 %
PCO2 ART: 54.2 mmHg — AB (ref 35.0–45.0)
PCO2 ART: 51.9 mmHg — AB (ref 35.0–45.0)
PH ART: 7.19 — AB (ref 7.350–7.450)
PH ART: 7.285 — AB (ref 7.350–7.450)
PO2 ART: 94 mmHg (ref 80.0–100.0)
Patient temperature: 36.1
Patient temperature: 34.9
TCO2: 23 mmol/L (ref 0–100)
TCO2: 27 mmol/L (ref 0–100)
pO2, Arterial: 81 mmHg (ref 80.0–100.0)

## 2015-08-22 LAB — STREP PNEUMONIAE URINARY ANTIGEN: STREP PNEUMO URINARY ANTIGEN: NEGATIVE

## 2015-08-22 LAB — DIFFERENTIAL
BASOS ABS: 0.1 10*3/uL (ref 0.0–0.1)
Basophils Relative: 1 %
Eosinophils Absolute: 0.2 10*3/uL (ref 0.0–0.7)
Eosinophils Relative: 2 %
LYMPHS PCT: 31 %
Lymphs Abs: 3.8 10*3/uL (ref 0.7–4.0)
MONOS PCT: 1 %
Monocytes Absolute: 0.1 10*3/uL (ref 0.1–1.0)
NEUTROS ABS: 8.2 10*3/uL — AB (ref 1.7–7.7)
Neutrophils Relative %: 65 %

## 2015-08-22 LAB — CARBOXYHEMOGLOBIN
CARBOXYHEMOGLOBIN: 0.6 % (ref 0.5–1.5)
Carboxyhemoglobin: 0.7 % (ref 0.5–1.5)
Methemoglobin: 0.8 % (ref 0.0–1.5)
Methemoglobin: 1 % (ref 0.0–1.5)
O2 SAT: 38.9 %
O2 SAT: 59.7 %
TOTAL HEMOGLOBIN: 13 g/dL — AB (ref 13.5–18.0)
TOTAL HEMOGLOBIN: 14.8 g/dL (ref 13.5–18.0)

## 2015-08-22 LAB — LIPID PANEL
Cholesterol: 155 mg/dL (ref 0–200)
HDL: 24 mg/dL — ABNORMAL LOW (ref >40)
LDL CALC: 112 mg/dL — AB (ref 0–99)
TRIGLYCERIDES: 97 mg/dL (ref <150)
Total CHOL/HDL Ratio: 6.5 RATIO
VLDL: 19 mg/dL (ref 0–40)

## 2015-08-22 LAB — FIBRINOGEN: FIBRINOGEN: 231 mg/dL (ref 204–475)

## 2015-08-22 LAB — COMPREHENSIVE METABOLIC PANEL
ALK PHOS: 73 U/L (ref 38–126)
ALT: 40 U/L (ref 17–63)
AST: 85 U/L — ABNORMAL HIGH (ref 15–41)
Albumin: 2.6 g/dL — ABNORMAL LOW (ref 3.5–5.0)
Anion gap: 17 — ABNORMAL HIGH (ref 5–15)
BILIRUBIN TOTAL: 1.2 mg/dL (ref 0.3–1.2)
BUN: 20 mg/dL (ref 6–20)
CALCIUM: 9 mg/dL (ref 8.9–10.3)
CO2: 20 mmol/L — ABNORMAL LOW (ref 22–32)
CREATININE: 1.45 mg/dL — AB (ref 0.61–1.24)
Chloride: 100 mmol/L — ABNORMAL LOW (ref 101–111)
GFR calc Af Amer: 55 mL/min — ABNORMAL LOW (ref >60)
GFR, EST NON AFRICAN AMERICAN: 47 mL/min — AB (ref >60)
Glucose, Bld: 294 mg/dL — ABNORMAL HIGH (ref 65–99)
Potassium: 4 mmol/L (ref 3.5–5.1)
SODIUM: 137 mmol/L (ref 135–145)
TOTAL PROTEIN: 5.6 g/dL — AB (ref 6.5–8.1)

## 2015-08-22 LAB — GLUCOSE, CAPILLARY
GLUCOSE-CAPILLARY: 327 mg/dL — AB (ref 65–99)
Glucose-Capillary: 334 mg/dL — ABNORMAL HIGH (ref 65–99)

## 2015-08-22 LAB — PROTIME-INR
INR: 2.11 — ABNORMAL HIGH (ref 0.00–1.49)
INR: 2.06 — ABNORMAL HIGH (ref 0.00–1.49)
PROTHROMBIN TIME: 23.5 s — AB (ref 11.6–15.2)
PROTHROMBIN TIME: 23.1 s — AB (ref 11.6–15.2)

## 2015-08-22 LAB — TROPONIN I
TROPONIN I: 0.3 ng/mL — AB (ref <0.031)
TROPONIN I: 3.11 ng/mL — AB (ref <0.031)

## 2015-08-22 LAB — ETHANOL

## 2015-08-22 LAB — LACTIC ACID, PLASMA: Lactic Acid, Venous: 9.8 mmol/L (ref 0.5–2.0)

## 2015-08-22 LAB — MAGNESIUM
Magnesium: 2.4 mg/dL (ref 1.7–2.4)
Magnesium: 2.2 mg/dL (ref 1.7–2.4)

## 2015-08-22 LAB — APTT: aPTT: 86 seconds — ABNORMAL HIGH (ref 24–37)

## 2015-08-22 LAB — MRSA PCR SCREENING: MRSA BY PCR: NEGATIVE

## 2015-08-22 MED ORDER — NOREPINEPHRINE BITARTRATE 1 MG/ML IV SOLN
20.0000 ug/min | Freq: Once | INTRAVENOUS | Status: AC
  Administered 2015-08-22: 20 ug/min via INTRAVENOUS

## 2015-08-22 MED ORDER — SODIUM CHLORIDE 0.9 % IV SOLN: 250.0000 mL | INTRAVENOUS | Status: DC | PRN

## 2015-08-22 MED ORDER — SODIUM CHLORIDE 0.9 % IV SOLN: INTRAVENOUS | Status: DC | PRN

## 2015-08-22 MED ORDER — AMIODARONE HCL IN DEXTROSE 360-4.14 MG/200ML-% IV SOLN: 30.0000 mg/h | INTRAVENOUS | Status: DC

## 2015-08-22 MED ORDER — PANTOPRAZOLE SODIUM 40 MG IV SOLR: 40.0000 mg | Freq: Two times a day (BID) | INTRAVENOUS | Status: DC

## 2015-08-22 MED ORDER — SODIUM BICARBONATE 8.4 % IV SOLN
100.0000 meq | Freq: Once | INTRAVENOUS | Status: AC
  Administered 2015-08-22: 100 meq via INTRAVENOUS
  Filled 2015-08-22: qty 50

## 2015-08-22 MED ORDER — SODIUM BICARBONATE 8.4 % IV SOLN
INTRAVENOUS | Status: AC
  Filled 2015-08-22: qty 200

## 2015-08-22 MED ORDER — MORPHINE SULFATE (PF) 4 MG/ML IV SOLN: 5.0000 mg | Freq: Once | INTRAVENOUS | Status: AC

## 2015-08-22 MED ORDER — DOBUTAMINE IN D5W 4-5 MG/ML-% IV SOLN
2.5000 ug/kg/min | INTRAVENOUS | Status: DC
  Administered 2015-08-22: 2.5 ug/kg/min via INTRAVENOUS
  Filled 2015-08-22: qty 250

## 2015-08-22 MED ORDER — SODIUM CHLORIDE 0.9 % IV SOLN: 2000.0000 mL | Freq: Once | INTRAVENOUS | Status: DC

## 2015-08-22 MED ORDER — PIPERACILLIN-TAZOBACTAM 3.375 G IVPB
3.3750 g | Freq: Three times a day (TID) | INTRAVENOUS | Status: DC
  Administered 2015-08-22: 3.375 g via INTRAVENOUS
  Filled 2015-08-22 (×3): qty 50

## 2015-08-22 MED ORDER — DEXTROSE 5 % IV SOLN: 1.0000 g | INTRAVENOUS | Status: DC

## 2015-08-22 MED ORDER — VANCOMYCIN HCL IN DEXTROSE 750-5 MG/150ML-% IV SOLN
750.0000 mg | Freq: Two times a day (BID) | INTRAVENOUS | Status: DC
  Administered 2015-08-22: 750 mg via INTRAVENOUS
  Filled 2015-08-22 (×2): qty 150

## 2015-08-22 MED ORDER — MIDAZOLAM HCL 2 MG/2ML IJ SOLN
1.0000 mg | INTRAMUSCULAR | Status: DC | PRN
  Administered 2015-08-22: 2 mg via INTRAVENOUS
  Filled 2015-08-22: qty 2

## 2015-08-22 MED ORDER — DEXTROSE 5 % IV SOLN
30.0000 ug/min | INTRAVENOUS | Status: DC
  Administered 2015-08-22 (×3): 400 ug/min via INTRAVENOUS
  Filled 2015-08-22 (×4): qty 4

## 2015-08-22 MED ORDER — PHENYLEPHRINE HCL 10 MG/ML IJ SOLN
30.0000 ug/min | INTRAVENOUS | Status: DC
  Administered 2015-08-22: 200 ug/min via INTRAVENOUS
  Filled 2015-08-22: qty 1

## 2015-08-22 MED ORDER — NOREPINEPHRINE BITARTRATE 1 MG/ML IV SOLN
0.0000 ug/min | Freq: Once | INTRAVENOUS | Status: DC
  Administered 2015-08-22: 70 ug/min via INTRAVENOUS

## 2015-08-22 MED ORDER — NOREPINEPHRINE BITARTRATE 1 MG/ML IV SOLN
0.0000 ug/min | INTRAVENOUS | Status: DC
  Filled 2015-08-22: qty 4

## 2015-08-22 MED ORDER — MORPHINE SULFATE (PF) 4 MG/ML IV SOLN
5.0000 mg | Freq: Once | INTRAVENOUS | Status: DC
Start: 2015-08-22 — End: 2015-08-22

## 2015-08-22 MED ORDER — INSULIN ASPART 100 UNIT/ML ~~LOC~~ SOLN
0.0000 [IU] | SUBCUTANEOUS | Status: DC
  Administered 2015-08-22 (×2): 11 [IU] via SUBCUTANEOUS

## 2015-08-22 MED ORDER — PANTOPRAZOLE SODIUM 40 MG IV SOLR: 40.0000 mg | Freq: Every day | INTRAVENOUS | Status: DC

## 2015-08-22 MED ORDER — NOREPINEPHRINE BITARTRATE 1 MG/ML IV SOLN
0.0000 ug/min | INTRAVENOUS | Status: DC
  Administered 2015-08-22 (×3): 70 ug/min via INTRAVENOUS
  Filled 2015-08-22 (×3): qty 16

## 2015-08-22 MED ORDER — SODIUM BICARBONATE 8.4 % IV SOLN
INTRAVENOUS | Status: AC
  Filled 2015-08-22: qty 50

## 2015-08-22 MED ORDER — MORPHINE SULFATE (PF) 4 MG/ML IV SOLN
5.0000 mg | INTRAVENOUS | Status: DC | PRN
  Administered 2015-08-22: 5 mg via INTRAVENOUS
  Filled 2015-08-22 (×2): qty 2

## 2015-08-22 MED ORDER — FENTANYL CITRATE (PF) 100 MCG/2ML IJ SOLN: 25.0000 ug | INTRAMUSCULAR | Status: DC | PRN

## 2015-08-22 MED ORDER — MORPHINE SULFATE (PF) 2 MG/ML IV SOLN
INTRAVENOUS | Status: AC
  Administered 2015-08-22: 5 mg via INTRAVENOUS
  Filled 2015-08-22: qty 3

## 2015-08-22 MED ORDER — AMIODARONE HCL IN DEXTROSE 360-4.14 MG/200ML-% IV SOLN
60.0000 mg/h | INTRAVENOUS | Status: AC
  Administered 2015-08-21 – 2015-08-22 (×2): 60 mg/h via INTRAVENOUS
  Filled 2015-08-22: qty 200

## 2015-08-24 ENCOUNTER — Telehealth: Payer: Self-pay

## 2015-08-24 LAB — LEGIONELLA PNEUMOPHILA SEROGP 1 UR AG: L. PNEUMOPHILA SEROGP 1 UR AG: NEGATIVE

## 2015-08-24 LAB — HEMOGLOBIN A1C
HEMOGLOBIN A1C: 6.4 % — AB (ref 4.8–5.6)
MEAN PLASMA GLUCOSE: 137 mg/dL

## 2015-08-24 NOTE — Telephone Encounter (Signed)
On 08/24/2015 I received the original death certificate from [facility]. The death certificate is for cremation. The patient is a patient of Educational [employer]. The death certificate will be taken to the [facility]) unit tomorrow am for signature. On 2015/09/09 I received the death certificate from [place]. I got the death certificate ready and called the funeral home to let them know it was ready for pickup.

## 2015-08-24 NOTE — Telephone Encounter (Signed)
On 31-Aug-2015 I received a death certificate from [facility]. The death certificate is for cremation. The patient is a patient of Educational psychologist. The death certificate will be taken to [facility]) today for signature.  On 08/31/15 I received the death certificate back from [place]. I got the death certificate ready and faxed the death certificate to the funeral home per their request.

## 2015-08-27 LAB — CULTURE, BLOOD (ROUTINE X 2): CULTURE: NO GROWTH

## 2015-08-28 LAB — CULTURE, BLOOD (ROUTINE X 2)

## 2015-09-10 NOTE — Progress Notes (Signed)
eLink Physician-Brief Progress Note Patient Name: RAMONA CLARIDA DOB: 1946/07/15 MRN: [id number]   Date of Service  2015-09-02  HPI/Events of Note  Remains hypotensive. Currently on Norepinephrine and Dobutamine IV infusions.   eICU Interventions  Will order: 1. Phenylephrine IV infusion. Titrate to MAP >= 65.  2. Send 5 AM ABG now.      Intervention Category Major Interventions: Shock - evaluation and management;Hypotension - evaluation and management  Kimberlee Shoun Cornelia Copa 02-Sep-2015, 4:15 AM

## 2015-09-10 NOTE — H&P (Signed)
PULMONARY / CRITICAL CARE MEDICINE   Name: Derek Odonnell MRN: [id number] DOB: [birth year]    ADMISSION DATE:  09/04/2015 CONSULTATION DATE:  2015-08-27  REFERRING MD :  Jola Schmidt, M.D. - EDP  CHIEF COMPLAINT:  Cardiac Arrest  INITIAL PRESENTATION: 69 year old male with known nonischemic cardiomyopathy as well as coronary artery disease. Patient had witnessed arrest and approximately one hour of time until return of spontaneous circulation which occurred in the emergency department. Patient did receive defibrillation with AED.  STUDIES:  Port CXR 11/11 - there is opacification of the left hemithorax. Endotracheal tube is high.  SIGNIFICANT EVENTS: 11/11 - Out of hospital arrest w/ intubation  HISTORY OF PRESENT ILLNESS:  Patient suffered an out of hospital witnessed arrest. One hour to return of spontaneous circulation. On arrival in the ED patient was hypotensive. Left internal jugular central venous catheter was placed by myself for central access and ease of administering vasopressor support. Patient's wife at the bedside provided history. Patient apparently did not strike his head or suffer any trauma falling. Interestingly the patient's wife previously reported no illnesses in the days or weeks preceding this event, but with further questioning by cardiology she did admit that he had an intermittent cough and was diagnosed with bronchitis recently. In the emergency room the patient was given amiodarone and initially started on epinephrine. He was then transitioned to Levophed and a sodium bicarbonate infusion was started. I was consulted to provide further assistance with the patient and admit the patient to the intensive care unit.  PAST MEDICAL HISTORY:  Past Medical History  Diagnosis Date  . Hypertension   . Syncope     a. 10/2012 Echo: EF 60-65%, no reg wma, mild MR, mildly dil LA.  . CHB (complete heart block) ([place])     a. 10/2012 s/p SJM 22[address] DR RF DC PPM, ser #  [id number].  Marland Kitchen A-fib ([place])   . Pacemaker   . Colon cancer metastasized to liver ([place]) ~ [place]    mets to the liver with a liver resection  . Shoulder impingement     "both; flare up q now and then" (05/09/2014)  . Chronic systolic heart failure ([place])   . Other primary cardiomyopathies   . Coronary artery disease   . Shortness of breath     " AT TIMES "  . CHF (congestive heart failure) ([place])      PAST SURGICAL HISTORY: Past Surgical History  Procedure Laterality Date  . Liver resection  ~ 1989    Liver CA  . Insert / replace / remove pacemaker  11/05/2012  . Colon surgery  ~ 1988    "for CA"  . Refractive surgery Bilateral   . Coronary stent placement  06/18/2014    MID CIRC & RCA  . Cardiac catheterization  06/18/2014    DR Irish Lack  . Permanent pacemaker insertion N/A 11/05/2012    Procedure: PERMANENT PACEMAKER INSERTION;  Surgeon: Evans Lance, MD;  Location: [facility] CATH LAB;  Service: Cardiovascular;  Laterality: N/A;  . Left heart catheterization with coronary angiogram N/A 05/15/2014    Procedure: LEFT HEART CATHETERIZATION WITH CORONARY ANGIOGRAM;  Surgeon: Jettie Booze, MD;  Location: [facility] CATH LAB;  Service: Cardiovascular;  Laterality: N/A;  . Percutaneous coronary stent intervention (pci-s) N/A 06/18/2014    Procedure: PERCUTANEOUS CORONARY STENT INTERVENTION (PCI-S);  Surgeon: Jettie Booze, MD;  Location: [facility] CATH LAB;  Service: Cardiovascular;  Laterality: N/A;  3.0/20 Promus DEstent to distal  RCA  . Cardiac catheterization  06/18/2014    Procedure: INTRAVASCULAR PRESSURE WIRE/FFR STUDY;  Surgeon: Jettie Booze, MD;  Location: [facility] CATH LAB;  Service: Cardiovascular;;  MId LAD  . Percutaneous coronary intervention-balloon only  06/18/2014    Procedure: PERCUTANEOUS CORONARY INTERVENTION-BALLOON ONLY;  Surgeon: Jettie Booze, MD;  Location: [facility] CATH LAB;  Service: Cardiovascular;;  CTO Mid CFX    Prior to Admission medications   Medication Sig Start  Date End Date Taking? Authorizing Provider  aspirin 325 MG buffered tablet Take 1 tablet (325 mg total) by mouth daily. 11/15/12   Evans Lance, MD  carvedilol (COREG) 3.125 MG tablet Take 1 tablet (3.125 mg total) by mouth 2 (two) times daily with a meal. 04/29/15   Jettie Booze, MD  clopidogrel (PLAVIX) 75 MG tablet Take 1 tablet (75 mg total) by mouth daily. 04/29/15   Jettie Booze, MD  furosemide (LASIX) 40 MG tablet Take 1 tablet (40 mg total) by mouth daily. 04/29/15   Jettie Booze, MD  lisinopril (PRINIVIL,ZESTRIL) 10 MG tablet Take 1.5 tablets (15 mg total) by mouth daily. 04/29/15   Jettie Booze, MD  Melatonin 1 MG TABS Take 1 tablet (1 mg total) by mouth every evening. 07/11/15   Shawn C Joy, PA-C  nitroGLYCERIN (NITROSTAT) 0.4 MG SL tablet Place 1 tablet (0.4 mg total) under the tongue every 5 (five) minutes as needed for chest pain. 06/19/14   Brett Canales, PA-C  OVER THE COUNTER MEDICATION Take 1 packet by mouth daily. Vitamin packet    Historical Provider, MD  potassium chloride (K-DUR) 10 MEQ tablet Take 1 tablet (10 mEq total) by mouth daily. 04/29/15   Jettie Booze, MD   Allergies  Allergen Reactions  . Spironolactone Diarrhea    FAMILY HISTORY:  indicated that his mother is deceased. He indicated that his father is deceased.  SOCIAL HISTORY: Social History   Social History  . Marital Status: Married    Spouse Name: N/A  . Number of Children: N/A  . Years of Education: N/A   Social History Main Topics  . Smoking status: Light Tobacco Smoker -- 53 years    Types: Cigars  . Smokeless tobacco: Former Systems developer    Types: [place] date: 07/10/2014     Comment: 05/09/2014 "I use chew q now and then"  . Alcohol Use: 0.0 oz/week    0 Standard drinks or equivalent per week     Comment: 05/09/2014 "I've had 2 beers in the last 6 months"  . Drug Use: No  . Sexual Activity: No   Other Topics Concern  . None   Social History Narrative     REVIEW OF SYSTEMS: Unable to obtain review of systems given patient is noncommunicative and intubated.  VITAL SIGNS: Temp:  [96.8 F (36 C)-97 F (36.1 C)] 97 F (36.1 C) (11/12 0315) Pulse Rate:  [51-108] 61 (11/12 0045) Resp:  [14-35] 35 (11/12 0045) BP: (50-162)/(38-89) 110/75 mmHg (11/12 0045) SpO2:  [70 %-96 %] 91 % (11/12 0045) FiO2 (%):  [100 %] 100 % (11/12 0225) Weight:  [99.1 kg (218 lb 7.6 oz)] 99.1 kg (218 lb 7.6 oz) (11/12 0123) HEMODYNAMICS:   VENTILATOR SETTINGS: Vent Mode:  [-] PRVC FiO2 (%):  [100 %] 100 % Set Rate:  [22 bmp] 22 bmp Vt Set:  [540 mL] 540 mL PEEP:  [10 cmH20-12 cmH20] 12 cmH20 Plateau Pressure:  [31 cmH20] 31 cmH20 INTAKE /  OUTPUT: No intake or output data in the 24 hours ending 08-26-2015 0354  PHYSICAL EXAMINATION: General:  No acute distress. Laying in bed. Bloody secretions and endotracheal tube suctioning as well as OG-tube.  Integument:  Warm & dry. No rash on exposed skin. Healed surgical incision sites. Lymphatics:  No appreciated cervical or supraclavicular lymphadenoapthy. HEENT:  Moist mucus membranes. No scleral injection or icterus. Endotracheal tube in place.  Cardiovascular:  Regular rate with irregular rhythm. No edema. Pulmonary:  Coarse breath sounds auscultation bilaterally. Symmetric chest wall rise on ventilator. Abdomen: Soft. Normal bowel sounds. Protuberant. Musculoskeletal:  Normal bulk. No joint deformity or effusion appreciated. Neurological:  Comatose. Patient barely spontaneously moving his upper extremities. No withdrawal to pain. Psychiatric: Unable to assess as the patient is comatose.   LABS:  CBC  Recent Labs Lab 08/28/2015 2326  WBC 12.4*  HGB 16.0  HCT 49.2  PLT 113*   Coag's  Recent Labs Lab 08/28/2015 2326  APTT 86*  INR 2.11*   BMET  Recent Labs Lab 08/28/2015 2326  NA 137  K 4.0  CL 100*  CO2 20*  BUN 20  CREATININE 1.45*  GLUCOSE 294*   Electrolytes  Recent Labs Lab  08/30/2015 2326 08/26/2015 0048  CALCIUM 9.0  --   MG  --  2.4   Sepsis Markers No results for input(s): LATICACIDVEN, PROCALCITON, O2SATVEN in the last 168 hours. ABG  Recent Labs Lab 09/05/2015 2350  PHART 7.270*  PCO2ART 66.5*  PO2ART 86.0   Liver Enzymes  Recent Labs Lab 08/24/2015 2326  AST 85*  ALT 40  ALKPHOS 73  BILITOT 1.2  ALBUMIN 2.6*   Cardiac Enzymes  Recent Labs Lab 08/21/15 2326  TROPONINI 0.30*   Glucose No results for input(s): GLUCAP in the last 168 hours.  Imaging Ct Abdomen Pelvis Wo Contrast  2015/08/26  CLINICAL DATA:  Found down. CPR for 40 minutes. Ventricular tachycardia recorded by pacer. EXAM: CT CHEST, ABDOMEN AND PELVIS WITHOUT CONTRAST TECHNIQUE: Multidetector CT imaging of the chest, abdomen and pelvis was performed following the standard protocol without IV contrast. COMPARISON:  04/11/2014 chest CT FINDINGS: CT CHEST FINDINGS THORACIC INLET/BODY WALL: Endotracheal tube just below the clavicular heads. Left IJ central line, tip at the SVC level. Dual-chamber pacer leads from the left are in unremarkable position. MEDIASTINUM: Chronic cardiomegaly. There is a small pericardial effusion, size stable from comparison chest CT. Density measures greater than water, but similar to previous study. No hemopericardium suspected given stability of findings. There is anterior mediastinal hematoma which is dissecting diffusely but fairly small volume. This is maximal around left-sided anterior rib fractures and sternal fracture which are described below. No evidence of intramural aortic hematoma. No aortic aneurysm. Diffuse atherosclerosis of the coronary arteries. Orogastric tube tip is in the proximal stomach. LUNG WINDOWS: Widespread airspace disease, essentially involving the entire lung. Small right pleural effusion. No pneumothorax. Gas collection with dependent fluid level in the as ago esophageal recess on the right, suggesting pulmonary laceration. Right  lung airspace disease is dependent. OSSEOUS: See below CT ABDOMEN AND PELVIS FINDINGS Hepatobiliary: Linear calcifications in the right liver which are chronic nnd dystrophic appearing, presumably related to patient's history of liver resection in 1989. No acute finding.Decompressed gallbladder. Pancreas: Unremarkable. Spleen: Unremarkable. Adrenals/Urinary Tract: Negative adrenals. No hydronephrosis or stone. Bilateral renal cysts, up to 42 mm on the right. Unremarkable bladder. Reproductive:No pathologic findings. Stomach/Bowel: No obstruction or evidence of necrosis. Diffuse colonic stool. Nasogastric tube tip in the proximal stomach.  There is reticulation and calcification between the rectum and sacrum, presumably related to patient's history of colon surgery in 1988. Vascular/Lymphatic: No acute vascular abnormality. No mass or adenopathy. Peritoneal: No ascites or pneumoperitoneum. Musculoskeletal: There is displaced anterior left third through sixth costal chondral junction fracture (which are ossified). Transverse nondisplaced fracture across the sternal body. Although motion degraded, best seen on thin section imaging there is right first, second, third, fourth, fifth, and sixth rib fractures with up to mild displacement. Anterior superior corner fracture of T7 with mild widening. No visible posterior element extension or subluxation. There is diffuse ankylosis of the thoracic spine from bulky supraspinous and vertebral body osteophytes. 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 Extensive facet arthropathy and disc degeneration with foraminal stenoses. In case patient is not on spinal precautions, spinal fracture was called by telephone at the time of interpretation on 09-17-15 at 2:41 am to Dr. Venora Maples , who verbally acknowledged these results. IMPRESSION: 1. Widespread airspace disease, involving the entire left lung, likely massive  aspiration. Pre-existing pneumonia would certainly be obscured. There is a probable pulmonary laceration in the right lower lobe and some of the airspace opacity may reflect contusion. 2. Right first through sixth and left third through sixth rib fractures. Left-sided rib fractures are costochondral and associated with anterior mediastinal hematoma. 3. T7 anterior superior body fracture, likely an extension injury in this patient with diffuse spinal ankylosis. 4. Nondisplaced sternal body fracture. 5. Chronic cardiomegaly and small pericardial effusion. 6. No acute intra-abdominal findings. Electronically Signed   By: Monte Fantasia M.D.   On: 09-17-2015 02:48   Ct Head Wo Contrast  17-Sep-2015  CLINICAL DATA:  Found unresponsive. Status post CPR. Initial encounter. EXAM: CT HEAD WITHOUT CONTRAST TECHNIQUE: Contiguous axial images were obtained from the base of the skull through the vertex without intravenous contrast. COMPARISON:  CT of the head performed 11/02/2012 FINDINGS: There is no evidence of acute infarction, mass lesion, or intra- or extra-axial hemorrhage on CT. Prominence of the ventricles and sulci reflects mild cortical volume loss. Mild periventricular and subcortical white matter change likely reflects small vessel ischemic microangiopathy. The brainstem and fourth ventricle are within normal limits. The basal ganglia are unremarkable in appearance. The cerebral hemispheres demonstrate grossly normal gray-white differentiation. No mass effect or midline shift is seen. There is no evidence of fracture; visualized osseous structures are unremarkable in appearance. The orbits are within normal limits. There is opacification of the right maxillary sinus. The remaining paranasal sinuses and mastoid air cells are well-aerated. No significant soft tissue abnormalities are seen. IMPRESSION: 1. No acute intracranial pathology seen on CT. 2. Mild cortical volume loss and scattered small vessel ischemic  microangiopathy. 3. Opacification of the right maxillary sinus. Electronically Signed   By: Garald Balding M.D.   On: September 17, 2015 02:35   Ct Chest Wo Contrast  2015/09/17  CLINICAL DATA:  Found down. CPR for 40 minutes. Ventricular tachycardia recorded by pacer. EXAM: CT CHEST, ABDOMEN AND PELVIS WITHOUT CONTRAST TECHNIQUE: Multidetector CT imaging of the chest, abdomen and pelvis was performed following the standard protocol without IV contrast. COMPARISON:  04/11/2014 chest CT FINDINGS: CT CHEST FINDINGS THORACIC INLET/BODY WALL: Endotracheal tube just below the clavicular heads. Left IJ central line, tip at the SVC level. Dual-chamber pacer leads from the left are in unremarkable position. MEDIASTINUM: Chronic cardiomegaly. There is a small pericardial effusion, size stable from comparison chest CT. Density measures greater than water, but similar to previous study. No  hemopericardium suspected given stability of findings. There is anterior mediastinal hematoma which is dissecting diffusely but fairly small volume. This is maximal around left-sided anterior rib fractures and sternal fracture which are described below. No evidence of intramural aortic hematoma. No aortic aneurysm. Diffuse atherosclerosis of the coronary arteries. Orogastric tube tip is in the proximal stomach. LUNG WINDOWS: Widespread airspace disease, essentially involving the entire lung. Small right pleural effusion. No pneumothorax. Gas collection with dependent fluid level in the as ago esophageal recess on the right, suggesting pulmonary laceration. Right lung airspace disease is dependent. OSSEOUS: See below CT ABDOMEN AND PELVIS FINDINGS Hepatobiliary: Linear calcifications in the right liver which are chronic nnd dystrophic appearing, presumably related to patient's history of liver resection in 1989. No acute finding.Decompressed gallbladder. Pancreas: Unremarkable. Spleen: Unremarkable. Adrenals/Urinary Tract: Negative adrenals. No  hydronephrosis or stone. Bilateral renal cysts, up to 42 mm on the right. Unremarkable bladder. Reproductive:No pathologic findings. Stomach/Bowel: No obstruction or evidence of necrosis. Diffuse colonic stool. Nasogastric tube tip in the proximal stomach. There is reticulation and calcification between the rectum and sacrum, presumably related to patient's history of colon surgery in 1988. Vascular/Lymphatic: No acute vascular abnormality. No mass or adenopathy. Peritoneal: No ascites or pneumoperitoneum. Musculoskeletal: There is displaced anterior left third through sixth costal chondral junction fracture (which are ossified). Transverse nondisplaced fracture across the sternal body. Although motion degraded, best seen on thin section imaging there is right first, second, third, fourth, fifth, and sixth rib fractures with up to mild displacement. Anterior superior corner fracture of T7 with mild widening. No visible posterior element extension or subluxation. There is diffuse ankylosis of the thoracic spine from bulky supraspinous and vertebral body osteophytes. 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 Extensive facet arthropathy and disc degeneration with foraminal stenoses. In case patient is not on spinal precautions, spinal fracture was called by telephone at the time of interpretation on 09/15/15 at 2:41 am to Dr. Venora Maples , who verbally acknowledged these results. IMPRESSION: 1. Widespread airspace disease, involving the entire left lung, likely massive aspiration. Pre-existing pneumonia would certainly be obscured. There is a probable pulmonary laceration in the right lower lobe and some of the airspace opacity may reflect contusion. 2. Right first through sixth and left third through sixth rib fractures. Left-sided rib fractures are costochondral and associated with anterior mediastinal hematoma. 3. T7 anterior superior body fracture,  likely an extension injury in this patient with diffuse spinal ankylosis. 4. Nondisplaced sternal body fracture. 5. Chronic cardiomegaly and small pericardial effusion. 6. No acute intra-abdominal findings. Electronically Signed   By: Monte Fantasia M.D.   On: September 15, 2015 02:48   Dg Chest Port 1 View  09/15/2015  CLINICAL DATA:  Central line placement.  Initial encounter. EXAM: PORTABLE CHEST 1 VIEW COMPARISON:  Chest radiograph performed 08/18/2015 FINDINGS: The patient's left IJ line is noted ending about the mid to distal SVC. The endotracheal tube is seen ending 3-4 cm above the carina. An enteric tube is noted extending below the diaphragm. Mildly worsened diffuse left-sided airspace opacification remains concerning for pneumonia. Asymmetric pulmonary edema might have a similar appearance. No definite pleural effusion or pneumothorax is seen. The cardiomediastinal silhouette is mildly enlarged. A pacemaker is noted overlying the left chest wall, with leads ending overlying the right atrium and right ventricle. No acute osseous abnormalities are seen. External pacing pads are noted. IMPRESSION: 1. Left IJ line noted ending about the mid to distal SVC. 2. Endotracheal tube seen ending 3-4  cm above the carina. 3. Mildly worsened diffuse left-sided airspace opacification remains concerning for pneumonia. Asymmetric pulmonary edema might have a similar appearance. 4. Mild cardiomegaly noted. Electronically Signed   By: Garald Balding M.D.   On: 09-20-2015 01:03   Dg Chest Portable 1 View  09/02/2015  CLINICAL DATA:  Status post cardiac arrest. Hypoxia, acute onset. Initial encounter. EXAM: PORTABLE CHEST 1 VIEW COMPARISON:  Chest radiograph performed 07/11/2015 FINDINGS: The patient's endotracheal tube is seen ending 5 cm above the carina. The enteric tube is noted extending below the diaphragm. Diffuse left-sided airspace opacification raises concern for pneumonia. Asymmetric pulmonary edema is considered  less likely. No definite pleural effusion or pneumothorax is seen, though the right costophrenic angle is incompletely imaged on this study. The cardiomediastinal silhouette is mildly enlarged. No acute osseous abnormalities are identified. External pacing pads are noted. A pacemaker is noted overlying the left chest wall, with leads ending overlying the right atrium and right ventricle. IMPRESSION: 1. Endotracheal tube seen ending 5 cm above the carina. 2. Diffuse left-sided airspace opacification is concerning for pneumonia. Asymmetric pulmonary edema is considered less likely. Electronically Signed   By: Garald Balding M.D.   On: 08/13/2015 23:51     ASSESSMENT / PLAN:  PULMONARY OETT 11/11>>> A: Acute Hypoxic Respiratory Failure Severe CAP Acute Hypercarbic Respiratory Failure Hemoptysis - Secondary to CAP versus trauma from resuscitation.   P:   Continue Vent Bundle Repeat ABG in AM CT Chest w/o  CARDIOVASCULAR Left IJ CVL 11/12>> Left Rad Art 11/12>> A:  Cardiac Arrest - 1 hour downtime.  Shock - Septic vs Cardiogenic vs Hypovolemic from blood loss. H/O CAD H/O A fib & CHB - S/P Pacemaker. H/O NICM  P:  Cardiology consulted & appreciate recommendations Normothermia protocol Amiodarone gtt Trending SVO2 & using Dobutamine Levophed to maintain MAP & SBP Monitor on telemetry Trending cardiac biomarkers Checking TTE Holding ASA & Plavix.  Checking LA Checking lipid panel with a.m. labs  RENAL A:   Acute Renal Failure - Likely due to shock. Metabolic acidosis  P:   Trending UOP with Foley Catheter Trending renal function with daily BUN/Creatinine. Checking serum lactic acid Repeat ABG at 0500 Bicarbonate drip at 150 mL per hour  GASTROINTESTINAL A:  GI Bleeding  P:   Consider GI consult if patient survives CT Abdomen & Pelvis w/o Protonix IV q12hr Repeat CBC in AM NPO  HEMATOLOGIC A:   Coagulopathy  Anemia - Likely due to blood loss.   P:   Checking Fibrinogen with AM labs Trending INR daily Repeat CBC in AM  INFECTIOUS A:  Sepsis Severe CAP  P:   Procalcitonin algorithm   Respiratory Viral PCR 11/12>>> Urine Strep Ag 11/12>>> Urine Legionella Ag 11/12>>> BCx2 11/12>>> Sputum 11/12>>>  Abx:  Vancomycin, start date 11/12, day 1/x Zosyn, start date 11/12, day 1/x Doxycycline, start date 11/12, day 1/x  ENDOCRINE A:   Hyperglycemia     P:   Checking hemoglobin A1c Accu-Cheks every 4 hours Moderate dose sliding scale insulin coverage  NEUROLOGIC A:   Encephalopathy - Likely toxic metabolic.  P:   RASS goal: 0 to -1 PRN Versed IV  PRN Fentanyl IV  Checking EtOH Checking UDS Normothermia protocol EEG ordered CT scan of the head without contrast pending Neuro checks every 2 hours   FAMILY  - Updates: Patient's wife updated by both me and cardiology this morning.  - Inter-disciplinary family meet or Palliative Care meeting due by:  11/19  TODAY'S SUMMARY: Unfortunate 69 year old male with known history of nonischemic cardiomyopathy presenting after cardiac arrest approximately 1 hour before return of spontaneous circulation. Started on normothermia protocol secondary to ongoing concern for septic shock. Checking CT scan of the head, chest, abdomen, and pelvis given significant abnormalities on chest x-ray imaging and cardiac arrest. Coagulopathy of unclear etiology as the patient isn't currently on Coumadin. Continuing to hold antiplatelet agents. Suspect he may have some element of hepatic dysfunction with a remote prior history of resection for malignancy.   I spent a total of 47 minutes of critical care time this morning caring for this patient, discussing the plan of care with cardiology, updating the patient's wife on plan of care, & reviewing the patient's electronic medical record.  Sonia Baller Ashok Cordia, M.D. [facility] Pulmonary & Critical Care Pager:  [phone] After 3pm or if no response,  call [phone] 13-Sep-2015, 3:54 AM

## 2015-09-10 NOTE — Progress Notes (Signed)
Had a discussion with family regarding plan of care, patient is deteriorating rapidly.  There is another brother who will be arriving soon from [place].  Patient is in refractory acidosis and on maximal support from a hemodynamic standpoint.  After discussion with family decision was made to make patient a full DNR, no further escalation of care.  Will give 5 mg of IV morphine now and once son is here then will d/c pressors.  Additional CC time of 30 minutes.  Rush Farmer, M.D. [facility] Pulmonary/Critical Care Medicine. Pager: [phone]. After hours pager: [phone].

## 2015-09-10 NOTE — Progress Notes (Signed)
ANTIBIOTIC CONSULT NOTE - INITIAL  Pharmacy Consult for Vancomycin/Zosyn  Indication: rule out pneumonia, possible aspiration PNA  Allergies  Allergen Reactions  . Spironolactone Diarrhea   Patient Measurements: Weight: 218 lb 7.6 oz (99.1 kg) (from Oct 2016)  Vital Signs: BP: 110/75 mmHg (11/12 0045) Pulse Rate: 61 (11/12 0045)  Labs:  Recent Labs  08/12/2015 2326  WBC 12.4*  HGB 16.0  PLT 113*  CREATININE 1.45*   Estimated Creatinine Clearance: 55 mL/min (by C-G formula based on Cr of 1.45).  Medical History: Past Medical History  Diagnosis Date  . Hypertension   . Syncope     a. 10/2012 Echo: EF 60-65%, no reg wma, mild MR, mildly dil LA.  . CHB (complete heart block) ([place])     a. 10/2012 s/p SJM 2210 Accent DR RF DC PPM, ser # [id number].  Marland Kitchen A-fib ([place])   . Pacemaker   . Colon cancer metastasized to liver ([place]) ~ [place]    mets to the liver with a liver resection  . Shoulder impingement     "both; flare up q now and then" (05/09/2014)  . Chronic systolic heart failure ([place])   . Other primary cardiomyopathies   . Coronary artery disease   . Shortness of breath     " AT TIMES "  . CHF (congestive heart failure) [facility])    Assessment: 69 y/o M CPR patient with ROSC, WBC 12.4, acute renal failure, CXR with diffuse left sided-airspace disease, other labs reviewed.   Goal of Therapy:  Vancomycin trough level 15-20 mcg/ml  Plan:  -Vancomycin 750 mg IV q12h, will need decreased if renal function worsens -Zosyn 3.375G IV q8h to be infused over 4 hours -Trend WBC, temp, renal function -Drug level as indicated   Narda Bonds 25-Aug-2015,1:29 AM

## 2015-09-10 NOTE — Code Documentation (Signed)
Port CXR for central line placement

## 2015-09-10 NOTE — Progress Notes (Signed)
Nutrition Brief Note  Pt is on vent support. Per MD note, pt deteriorating rapidly, decision was made for full DNR with no escalation of care. No nutrition interventions warranted at this time. Please consult as needed.   Corrin Parker, MS, RD, LDN Pager # [phone]  After hours/ weekend pager # [phone]

## 2015-09-10 NOTE — ED Notes (Signed)
EMS reports pt was in [place] talking to [employer] when he collapsed.  Pt was pulseless and CPR was started by bystander.  AED was applied to pt and advised shock.  Pt was shocked x's 1 before EMS arrival

## 2015-09-10 NOTE — Progress Notes (Addendum)
Briefly seen and examined. Intubated, sedated on vent, arctic sun in place, incoordinate chest wall motion with the vent, irregularly irregular, blood noted in the ET tube, cool extremities, poor pulses, no edema. Events reviewed overnight. He is currently on arctic sun protocol with multiple pressors. Down time reported to be about 40 minutes prior to ROSC. EF reportedly around 15-20% by bedside echo. He is in a-fib on amiodarone. No ST elevation is noted. Troponin has risen to 3.11. Prognosis is quite guarded at this time. Appreciate PCCM goals of care discussion with the family - I briefly spoke with his daughter-in-law who is a CCU nurse and she understands the seriousness of his condition.    Pixie Casino, MD, [facility] Attending Cardiologist [place]

## 2015-09-10 NOTE — Progress Notes (Signed)
PCCM Attending Family Discussion Note:  I had a lengthy discussion with the patient's wife, son, daughter, daughter-in-law, and personal friend at bedside this morning. I explained that despite our best efforts he has worsening acidosis and shock now requiring multiple vasopressor agents which are ineffective in maintaining his mean arterial pressure. I also again relayed his prolonged time without spontaneous circulation & the likelihood of neurological injury. With his multi-system organ failure we have reached maximum support as I do not believe he would even tolerate CVVHD with his degree of hypotension. I recommended to the family that they strongly consider making him a DNR as he will likely suffer cardiac arrest soon if not sometime today and I believe this would be yet one more marker of his continued clinical deterioration. The family requested to pray for him at bedside and once complete they are discussing his code status amongst themselves.  Sonia Baller Ashok Cordia, M.D. [facility] Pager:  [phone] After 3pm or if no response, call [phone]

## 2015-09-10 NOTE — Progress Notes (Addendum)
[facility] Progress Note Patient Name: Derek Odonnell DOB: [birth year] MRN: [id number]   Date of Service  09/02/15  HPI/Events of Note  ABG = 7.19/54.2/94.0  eICU Interventions  Will order: 1. NaHCO3 100 meq IV now. 2. Increase PRVC rate to 30. 3. ABG at 6 AM.      Intervention Category Major Interventions: Acid-Base disturbance - evaluation and management;Respiratory failure - evaluation and management  Juniper Cobey Cornelia Copa 02-Sep-2015, 4:44 AM

## 2015-09-10 NOTE — ED Notes (Signed)
Pt to CT at this time.

## 2015-09-10 NOTE — Consult Note (Signed)
Primary Cardiologist: Dr. Irish Lack (last visit 04/03/2015)  Reason for Consultation: Cardiac arrest  HPI: 23 CA man brought to ER with cardiac arrest. He has history of persistent AF (not on anticoagulation), complete heart block s/p dual chamber ST [employer] Medical Pacemaker 10/2012 (althoug has mode of VVIR), CAD (POBA of midLCX with distal CTO, DES to dRCA, moderate disease in LAD -- 06/18/2014), cardiomyopathy with severe LV systolic dysfunction and chronic systolic HF. Apparently ICD had been discussed with him in the past but he put it off.   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 Bystanders prevented a fall but started CPR. One shock was delivered by AED on the scene. EMS arrived. Prolonged CPR (almost 40 minutes or more), multiple Epi, intubated en route. Per ER provider, he was getting active CPR upon arrival with no pulse. There was mention of bleeding from airwy post intubation. In the ER. He was started on Amiodarone infusion and Epi at 5 mcg followed by Norepi at high dose with good BP response. A left IJ central venous line has been placed by ICU provider.   Patient was evaluated by me in the ER. A limited bedside echo suggests severe global hypokinesis with LVEF estimated to be 15%. No significant pericardial effusion. ECG suggests AF, PVC, IVCD and few paced V complexes. No obvious ST elevation.   Met with wife and explained critical situation and plan for supportive care for now. Also spoke to daughter in law on the phone who is a cardiac ICU nurse in [place]. Questions answered.   Discussed with on call interventional cardiology.    Pacemaker interrogated using remote monitoring system and it appears that he had rapid V rate eventsx3 starting around 10:17 pm (no events EGM available for review so not sure if AF with RVR or VT events)    TTE 10/01/2014:  Study Conclusions - Left ventricle: Diffuse hypokinesis worse in the mid and  basal inferior wall. The cavity size was moderately dilated. Systolic function was severely reduced. The estimated ejection fraction was in the range of 25% to 30%. - Aortic valve: There was mild stenosis. - Mitral valve: There was mild regurgitation. - Left atrium: The atrium was severely dilated. - Right atrium: The atrium was moderately dilated. - Atrial septum: No defect or patent foramen ovale was identified. - Pulmonary arteries: PA peak pressure: 33 mm Hg (S).  TTE 05/2014 25-30% TTE 04/2014 LVEF 35-40% TTE 10/2012: Normal LVEF   Review of Systems:  Unable to obtain as he is unresponsive and intubated    Past Medical History  Diagnosis Date  . Hypertension   . Syncope     a. 10/2012 Echo: EF 60-65%, no reg wma, mild MR, mildly dil LA.  . CHB (complete heart block) ([place])     a. 10/2012 s/p SJM 2210 Accent DR RF DC PPM, ser # [id number].  Marland Kitchen A-fib ([place])   . Pacemaker   . Colon cancer metastasized to liver ([place]) ~ [place]    mets to the liver with a liver resection  . Shoulder impingement     "both; flare up q now and then" (05/09/2014)  . Chronic systolic heart failure ([place])   . Other primary cardiomyopathies   . Coronary artery disease   . Shortness of breath     " AT TIMES "  . CHF (congestive heart failure) ([place])      (Not in a hospital  admission)   . insulin aspart  0-15 Units Subcutaneous 6 times per day  . pantoprazole (PROTONIX) IV  40 mg Intravenous Q12H  . sodium bicarbonate        Infusions: . sodium chloride    . sodium chloride    . amiodarone (NEXTERONE) IV bolus only 150 mg/100 mL    . DOBUTamine    . epinephrine 5 mcg/min (09/06/2015 2347)  . norepinephrine (LEVOPHED) Adult infusion    .  sodium bicarbonate 150 mEq in sterile water 1000 mL infusion 150 mL/hr at 09/02/2015 2358    Allergies  Allergen Reactions  . Spironolactone Diarrhea    Social History   Social History  . Marital Status: Married    Spouse Name: N/A  . Number of  Children: N/A  . Years of Education: N/A   Occupational History  . Not on file.   Social History Main Topics  . Smoking status: Light Tobacco Smoker -- 53 years    Types: Cigars  . Smokeless tobacco: Former Systems developer    Types: [place] date: 07/10/2014     Comment: 05/09/2014 "I use chew q now and then"  . Alcohol Use: 0.0 oz/week    0 Standard drinks or equivalent per week     Comment: 05/09/2014 "I've had 2 beers in the last 6 months"  . Drug Use: No  . Sexual Activity: No   Other Topics Concern  . Not on file   Social History Narrative    Family History  Problem Relation Age of Onset  . Stroke Mother     58s  . Stroke Father 31    PHYSICAL EXAM: Filed Vitals:   2015/09/13 0045  BP: 110/75  Pulse: 61  Resp: 35   BP fluctuating but now MAP above 60 mmHg on Levophed 80 mcg   No intake or output data in the 24 hours ending 09/13/2015 0119  General:  Intubated HEENT: dried blood around mouth and nares Neck: supple. no JVD. Carotids 2+ bilat; no bruits.,  Cor: PMI displaced. Irregular rate & rhythm. No rubs, gallops or murmurs. Lungs: basal crackles, decreased sounds LUL  Abdomen: soft, BS+  Extremities: no cyanosis, clubbing, rash, edema   Results for orders placed or performed during the hospital encounter of 09/08/2015 (from the past 24 hour(s))  CBC     Status: Abnormal   Collection Time: 08/11/2015 11:26 PM  Result Value Ref Range   WBC 12.4 (H) 4.0 - 10.5 K/uL   RBC 4.99 4.22 - 5.81 MIL/uL   Hemoglobin 16.0 13.0 - 17.0 g/dL   HCT 49.2 39.0 - 52.0 %   MCV 98.6 78.0 - 100.0 fL   MCH 32.1 26.0 - 34.0 pg   MCHC 32.5 30.0 - 36.0 g/dL   RDW 13.6 11.5 - 15.5 %   Platelets 113 (L) 150 - 400 K/uL  Differential     Status: Abnormal   Collection Time: 08/16/2015 11:26 PM  Result Value Ref Range   Neutrophils Relative % 65 %   Lymphocytes Relative 31 %   Monocytes Relative 1 %   Eosinophils Relative 2 %   Basophils Relative 1 %   Neutro Abs 8.2 (H) 1.7 - 7.7 K/uL    Lymphs Abs 3.8 0.7 - 4.0 K/uL   Monocytes Absolute 0.1 0.1 - 1.0 K/uL   Eosinophils Absolute 0.2 0.0 - 0.7 K/uL   Basophils Absolute 0.1 0.0 - 0.1 K/uL   WBC Morphology MILD LEFT SHIFT (1-5% METAS,  [place] MYELO, OCC BANDS)   Protime-INR     Status: Abnormal   Collection Time: 08/18/2015 11:26 PM  Result Value Ref Range   Prothrombin Time 23.5 (H) 11.6 - 15.2 seconds   INR 2.11 (H) 0.00 - 1.49  APTT     Status: Abnormal   Collection Time: 09/01/2015 11:26 PM  Result Value Ref Range   aPTT 86 (H) 24 - 37 seconds  Comprehensive metabolic panel     Status: Abnormal   Collection Time: 08/31/2015 11:26 PM  Result Value Ref Range   Sodium 137 135 - 145 mmol/L   Potassium 4.0 3.5 - 5.1 mmol/L   Chloride 100 (L) 101 - 111 mmol/L   CO2 20 (L) 22 - 32 mmol/L   Glucose, Bld 294 (H) 65 - 99 mg/dL   BUN 20 6 - 20 mg/dL   Creatinine, Ser 1.45 (H) 0.61 - 1.24 mg/dL   Calcium 9.0 8.9 - 10.3 mg/dL   Total Protein 5.6 (L) 6.5 - 8.1 g/dL   Albumin 2.6 (L) 3.5 - 5.0 g/dL   AST 85 (H) 15 - 41 U/L   ALT 40 17 - 63 U/L   Alkaline Phosphatase 73 38 - 126 U/L   Total Bilirubin 1.2 0.3 - 1.2 mg/dL   GFR calc non Af Amer 47 (L) >60 mL/min   GFR calc Af Amer 55 (L) >60 mL/min   Anion gap 17 (H) 5 - 15  Troponin I     Status: Abnormal   Collection Time: 08/24/2015 11:26 PM  Result Value Ref Range   Troponin I 0.30 (H) <0.031 ng/mL  I-stat troponin, ED     Status: Abnormal   Collection Time: 08/25/2015 11:27 PM  Result Value Ref Range   Troponin i, poc 0.32 (HH) 0.00 - 0.08 ng/mL   Comment NOTIFIED PHYSICIAN    Comment 3          I-Stat arterial blood gas, ED     Status: Abnormal   Collection Time: 08/19/2015 11:50 PM  Result Value Ref Range   pH, Arterial 7.270 (L) 7.350 - 7.450   pCO2 arterial 66.5 (HH) 35.0 - 45.0 mmHg   pO2, Arterial 86.0 80.0 - 100.0 mmHg   Bicarbonate 30.8 (H) 20.0 - 24.0 mEq/L   TCO2 33 0 - 100 mmol/L   O2 Saturation 95.0 %   Acid-Base Excess 2.0 0.0 - 2.0 mmol/L   Patient temperature  97.3 F    Collection site RADIAL, ALLEN'S TEST ACCEPTABLE    Drawn by Operator    Sample type ARTERIAL    Comment NOTIFIED PHYSICIAN   Magnesium     Status: None   Collection Time: 09/16/2015 12:48 AM  Result Value Ref Range   Magnesium 2.4 1.7 - 2.4 mg/dL  Carboxyhemoglobin     Status: None   Collection Time: 09/16/15 12:49 AM  Result Value Ref Range   Total hemoglobin 14.8 13.5 - 18.0 g/dL   O2 Saturation 38.9 %   Carboxyhemoglobin 0.6 0.5 - 1.5 %   Methemoglobin 0.8 0.0 - 1.5 %   Dg Chest Port 1 View  2015-09-16  CLINICAL DATA:  Central line placement.  Initial encounter. EXAM: PORTABLE CHEST 1 VIEW COMPARISON:  Chest radiograph performed 08/23/2015 FINDINGS: The patient's left IJ line is noted ending about the mid to distal SVC. The endotracheal tube is seen ending 3-4 cm above the carina. An enteric tube is noted extending below the diaphragm. Mildly worsened diffuse left-sided airspace opacification remains concerning for  pneumonia. Asymmetric pulmonary edema might have a similar appearance. No definite pleural effusion or pneumothorax is seen. The cardiomediastinal silhouette is mildly enlarged. A pacemaker is noted overlying the left chest wall, with leads ending overlying the right atrium and right ventricle. No acute osseous abnormalities are seen. External pacing pads are noted. IMPRESSION: 1. Left IJ line noted ending about the mid to distal SVC. 2. Endotracheal tube seen ending 3-4 cm above the carina. 3. Mildly worsened diffuse left-sided airspace opacification remains concerning for pneumonia. Asymmetric pulmonary edema might have a similar appearance. 4. Mild cardiomegaly noted. Electronically Signed   By: Garald Balding M.D.   On: 2015/09/21 01:03   Dg Chest Portable 1 View  08/17/2015  CLINICAL DATA:  Status post cardiac arrest. Hypoxia, acute onset. Initial encounter. EXAM: PORTABLE CHEST 1 VIEW COMPARISON:  Chest radiograph performed 07/11/2015 FINDINGS: The patient's  endotracheal tube is seen ending 5 cm above the carina. The enteric tube is noted extending below the diaphragm. Diffuse left-sided airspace opacification raises concern for pneumonia. Asymmetric pulmonary edema is considered less likely. No definite pleural effusion or pneumothorax is seen, though the right costophrenic angle is incompletely imaged on this study. The cardiomediastinal silhouette is mildly enlarged. No acute osseous abnormalities are identified. External pacing pads are noted. A pacemaker is noted overlying the left chest wall, with leads ending overlying the right atrium and right ventricle. IMPRESSION: 1. Endotracheal tube seen ending 5 cm above the carina. 2. Diffuse left-sided airspace opacification is concerning for pneumonia. Asymmetric pulmonary edema is considered less likely. Electronically Signed   By: Garald Balding M.D.   On: 09/06/2015 23:51     ASSESSMENT:  1. S/p cardiac arrest - high suspicion of arrhythmia (VT/VF) - prolonged CPR/ACLS -- over 40 minutes -- concerns for anoxic brain injury and multiorgan failure - ACS is not very obvious - no ST elevation; Trop 0.32; he does have CAD  - Wife reports coughing, bronchitis and possible pneumonia in the past couple weeks  - Cardiogenic shock, requiring pressor    PLAN/DISCUSSION:  -  Agree with supportive care in the ICU setting, CT head to rule out intracranial event, hypothermia protocol  - Agree with Amio infusion for now  - Norepi, Dobutamine - Hemodynamic monitoring  - Check SVCo2 sat - Monitor coags, hepatic and renal function, blood counts, lytes including Mag - Trend Trop although prolonged CPR is a confounder - Full echo  - If no bleeding, then continue ASA and Plavix  - No beta blocker for now  - Continue statin  - Monitor CVP - acceptable goal 10-14    - Strict I-O, daily weight   Cardiology service will follow  Guarded prognosis   Wandra Mannan, MD  Cardiology

## 2015-09-10 NOTE — Procedures (Signed)
Arterial Catheter Insertion Procedure Note Derek Odonnell [id number] [birth year]  Procedure: Insertion of Arterial Catheter  Indications: Blood pressure monitoring  Procedure Details Consent: Unable to obtain consent because of emergent medical necessity. Time Out: Verified patient identification, verified procedure, site/side was marked, verified correct patient position, special equipment/implants available, medications/allergies/relevent history reviewed, required imaging and test results available.  Performed  Maximum sterile technique was used including antiseptics, cap, gloves, gown, hand hygiene, mask and sheet. Skin prep: Chlorhexidine; local anesthetic administered 20 gauge catheter was inserted into left radial artery using the Seldinger technique.  Evaluation Blood flow good; BP tracing good. Complications: No apparent complications.   Derek Odonnell 09/15/15

## 2015-09-10 NOTE — ED Notes (Signed)
Dr. Susy Manor in to assess pt

## 2015-09-10 NOTE — Procedures (Signed)
Central Line Insertion Procedure Note  Consent:  Procedure was performed emergently. No consent was obtained.  Laterality:  Left Internal Jugular Vein  Description of Procedure: Patient was prepped and draped in sterile fashion.  Using bedside ultrasound the patient's left internal jugular vein was visualized.  The vein was compressible and nonpulsatile.  A total of 77mL of Lidocaine 1% with epinephrine was used to locally anesthetize the patient's skin.  Finder needle was then inserted into the patient's vein under direct ultrasound visualization and dark blood was aspirated.  The syringe was removed form the needle and blood flow was nonpulsatile.  The guidewire was then inserted through the needle into the patient's vein and needle was then removed.  Guidewire positioning in the vein was confirmed with bedside ultrasound. A skin nick was made with a sterile scalpel.  A dilator was passed over the guidewire into the subcutaneous tissue then removed.  The central venous catheter was then inserted over the guidewire into the vein.  The guidewire was then removed.  All ports were then aspirated and free of air before being flushed with sterile normal saline.  The ports were then clamped.  The catheter was sewen into place and a chlorhexidine bandage was applied. Postprocedure chest x-ray shows placement with the tip of the catheter in the distal SVC and no evidence of pneumothorax.  Complications:  None.  Estimated Blood Loss:  Less than 5cc.  Condition Post Procedure:  Remains critically ill in the emergency department awaiting transition to the intensive care unit.

## 2015-09-10 NOTE — Progress Notes (Signed)
Family wishes to stop all medications.  Medications turned off, MD present at bedside.  Will monitor patient closely.

## 2015-09-10 NOTE — Progress Notes (Signed)
eLink Physician-Brief Progress Note Patient Name: Derek Odonnell DOB: 09/02/1946 MRN: [id number]   Date of Service  September 20, 2015  HPI/Events of Note  Patient has T7 vertebral Fracture. Felt to be stable. Spinal precautions recommended.   eICU Interventions  Will order spinal precautions.      Intervention Category Intermediate Interventions: Other:  [name] Sep 20, 2015, 2:46 AM

## 2015-09-10 NOTE — Code Documentation (Signed)
All Clothes given to pt's wife

## 2015-09-10 NOTE — Discharge Summary (Signed)
Death Note: For complete accounting of the patient's history and physical on presentation please refer to the H&P dictated by me on August 25, 2015. In brief this is a 69 year old Caucasian male with known nonischemic cardiomyopathy as well as coronary artery disease. Patient suffered ventricular fibrillation arrest out of hospital with approximately 60 minutes before return of spontaneous circulation. He did undergo defibrillation with an AED in the field. Patient arrived in the emergency room intubated requiring vasopressor support. Central line was subsequently placed for increased central venous access. Patient was assessed by cardiology. Over the course of his brief admission his shock progressively worsened as did his metabolic acidosis.the patient was found to have bilateral consolidation significantly worse on the left compared with the right suggestive of underlying pneumonia as a possible precipitant. The patient had no purposeful movements over the course of his brief admission. With escalating vasopressor requirements as well as worsening acidosis multiple family discussions were held by both me and Dr. Nelda Marseille. Family ultimately decided to make the patient DO NOT RESUSCITATE with no escalation of care. The nurse documented that at 10:45 AM family wish to stop all medications. At 11:47 AM on 08/25/15 the patient had "no heart sounds or breath sounds" per nursing report after auscultating for 1 minute.  Diagnoses at Death: 1. Acute hypoxic respiratory failure  2. Metabolic acidosis  3. Cardiogenic & septic shock  4. Acute renal failure  5. Ventricular fibrillation cardiac arrest 6. Severe community acquired pneumonia  7. Chronic systolic congestive heart failure  8. Nonischemic cardiomyopathy  9. Coronary artery disease  10. History of complete heart block  11. History of colon cancer with metastasis

## 2015-09-10 NOTE — Progress Notes (Signed)
No heart sounds or breath sounds for one minute auscultated with Tanda Rockers, RN

## 2015-09-10 DEATH — deceased

## 2015-11-23 ENCOUNTER — Ambulatory Visit: Payer: Medicare Other | Admitting: Interventional Cardiology

## 2016-02-16 IMAGING — CT CT CHEST W/ CM
2 of 7 series · 12 of 36 positions shown, 15 images · IV contrast (APPLIED)
Comparison: Chest x-ray dated 05/08/2014

CLINICAL DATA: Progressive shortness of breath. Right pleural
effusion.

EXAM:
CT CHEST WITH CONTRAST
TECHNIQUE: Multidetector CT imaging of the chest was performed during
intravenous contrast administration.
CONTRAST:  80mL OMNIPAQUE IOHEXOL 300 MG/ML  SOLN

[Series 2: thorax 5.0 i31f 1 · axial · 0.77mm/px · z∈[-302,-48]mm · 11 of 63 slices shown, 14 images]
[im 6/63  mediastinal]
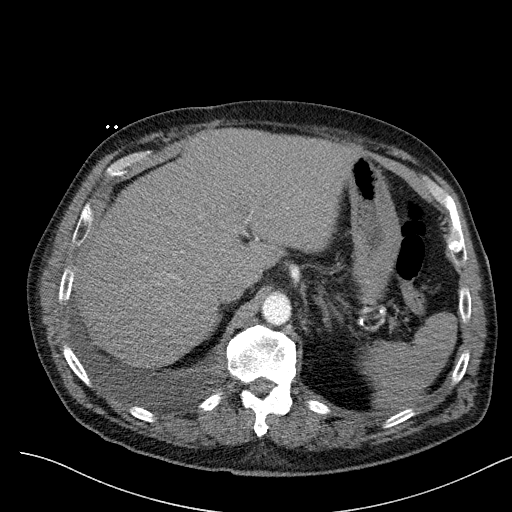
[im 6/63  lung]
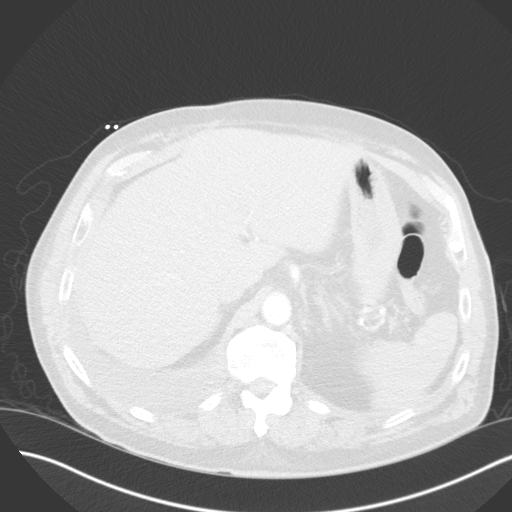
[im 11/63  lung]
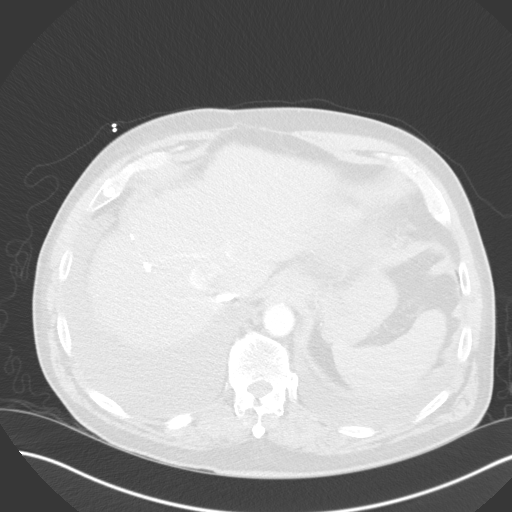
[im 16/63  lung]
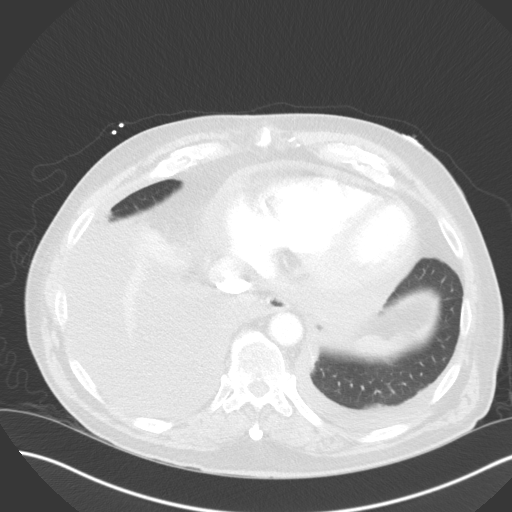
[im 21/63  lung]
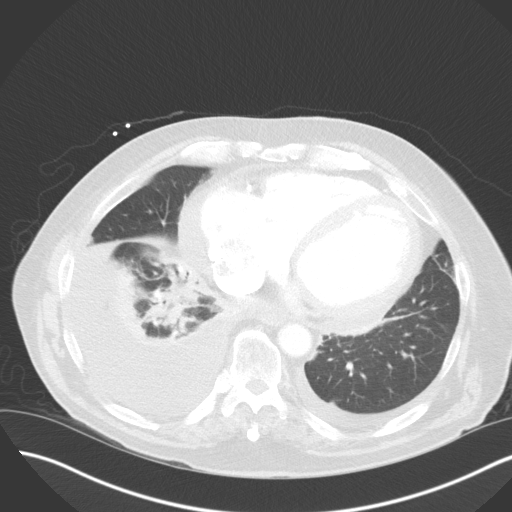
[im 26/63  mediastinal]
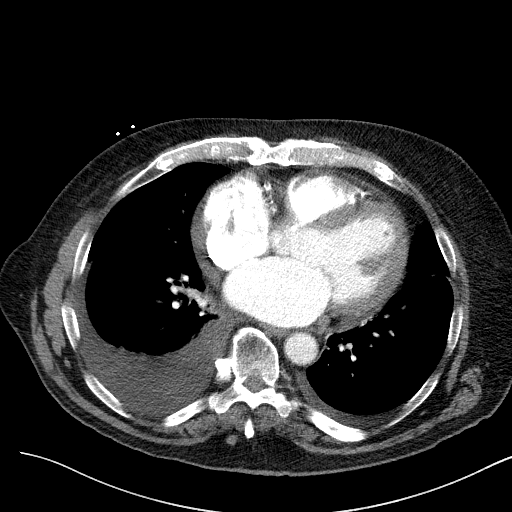
[im 26/63  lung]
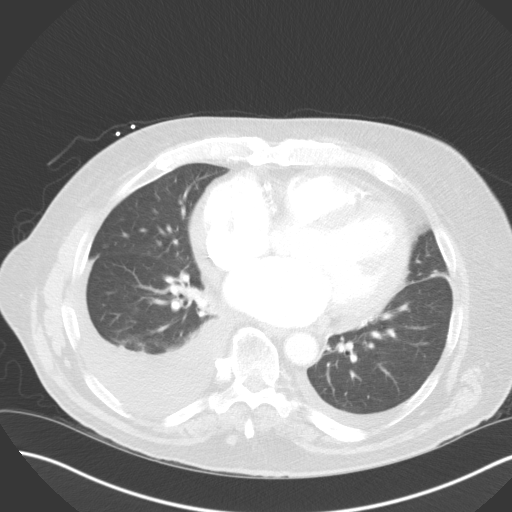
[im 32/63  lung]
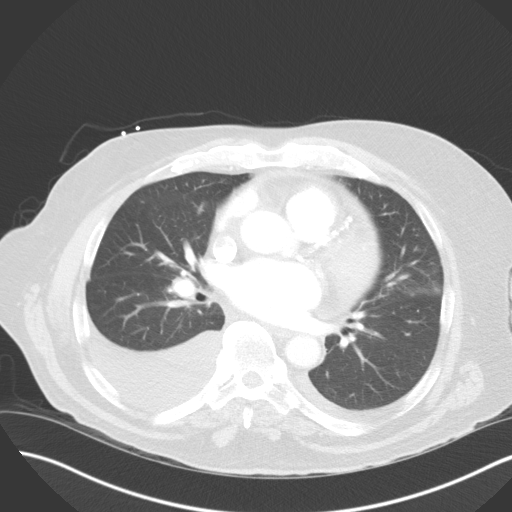
[im 37/63  lung]
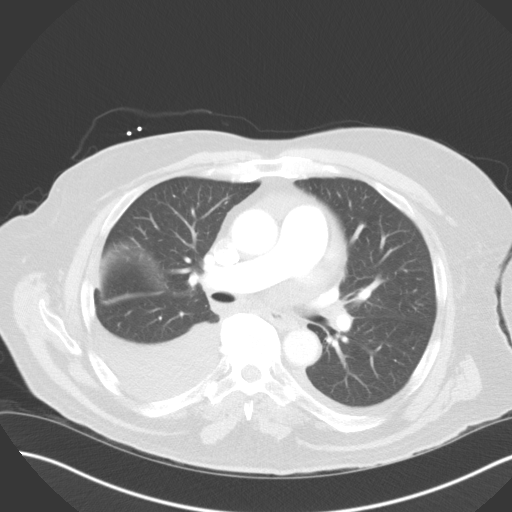
[im 42/63  lung]
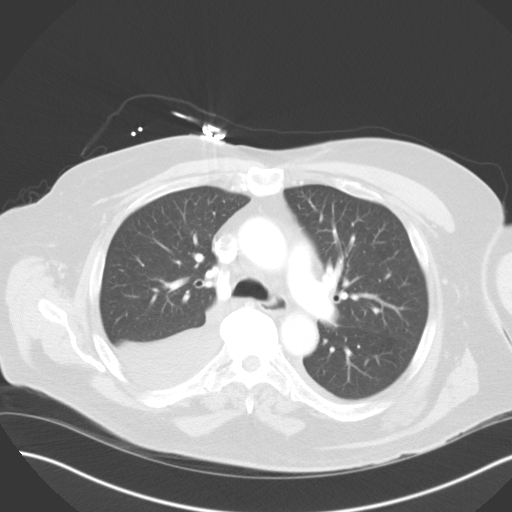
[im 47/63  mediastinal]
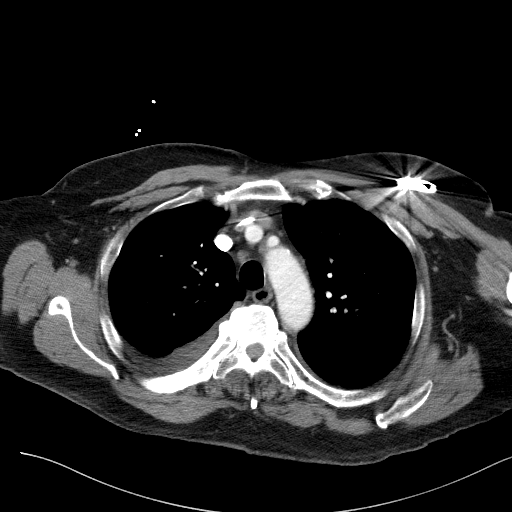
[im 47/63  lung]
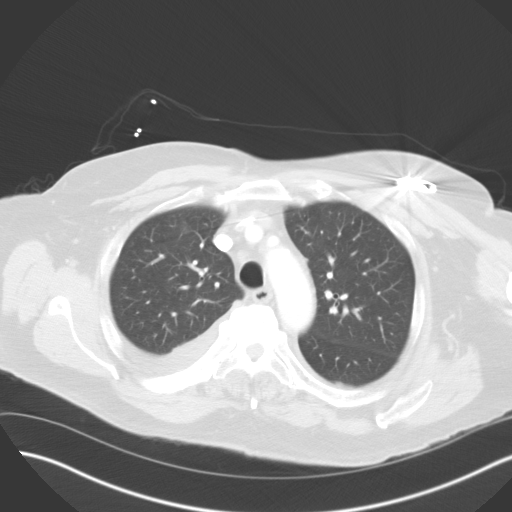
[im 52/63  lung]
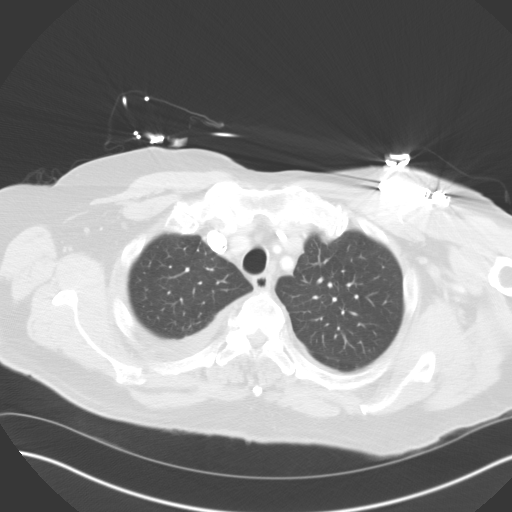
[im 57/63  lung]
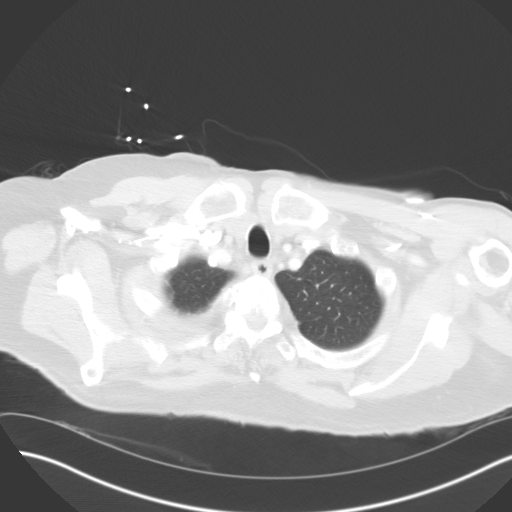

[Series 7: coronal · coronal · 0.59mm/px · 1 of 79 slices shown]
[im 40/79  lung]
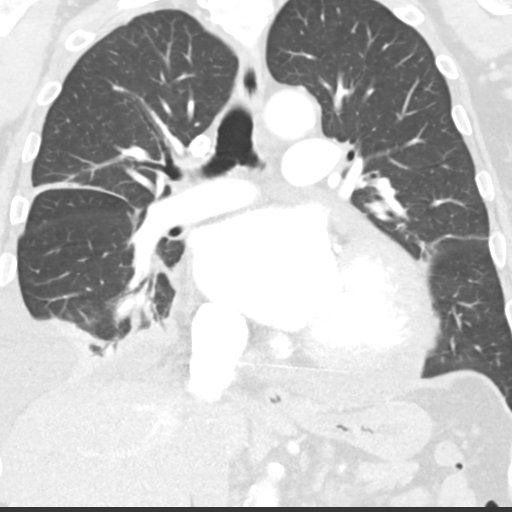

[12 of 36 positions shown; findings below may reference images not displayed]

FINDINGS: There is new cardiomegaly with a large right pleural effusion and a
small left effusion. There is compressive atelectasis in the right
lower lobe. There is a small pericardial effusion. The patient has
extensive calcifications in the coronary arteries. No hilar or
mediastinal adenopathy.

No acute osseous abnormality. Anterior osteophytes fuse most of the
thoracic spine.
IMPRESSION: New cardiomegaly with bilateral pleural effusions, large on the
right and small on the left. Small pericardial effusion.
